# Patient Record
Sex: Male | Born: 1980 | Race: White | Hispanic: No | Marital: Single | Smoking: Never smoker
Health system: Southern US, Community
[De-identification: ages and names within clinical notes are randomized; demographics above are authoritative.]

## PROBLEM LIST (undated history)

## (undated) DIAGNOSIS — F419 Anxiety disorder, unspecified: Secondary | ICD-10-CM

---

## 2001-02-23 ENCOUNTER — Encounter: Admission: RE | Admit: 2001-02-23 | Discharge: 2001-02-23 | Payer: Self-pay | Admitting: Sports Medicine

## 2001-03-09 ENCOUNTER — Encounter: Admission: RE | Admit: 2001-03-09 | Discharge: 2001-03-09 | Payer: Self-pay | Admitting: Sports Medicine

## 2001-09-27 ENCOUNTER — Ambulatory Visit (HOSPITAL_COMMUNITY): Admission: RE | Admit: 2001-09-27 | Discharge: 2001-09-27 | Payer: Self-pay | Admitting: Gastroenterology

## 2002-06-06 ENCOUNTER — Ambulatory Visit (HOSPITAL_COMMUNITY): Admission: RE | Admit: 2002-06-06 | Discharge: 2002-06-06 | Payer: Self-pay | Admitting: Orthopedic Surgery

## 2002-06-06 ENCOUNTER — Encounter: Payer: Self-pay | Admitting: Orthopedic Surgery

## 2011-06-03 ENCOUNTER — Inpatient Hospital Stay (HOSPITAL_COMMUNITY)
Admission: RE | Admit: 2011-06-03 | Discharge: 2011-06-13 | DRG: 430 | Disposition: A | Discharge status: Home / Self Care | Payer: BC Managed Care – PPO | Attending: Psychiatry | Admitting: Psychiatry

## 2011-06-03 ENCOUNTER — Encounter (HOSPITAL_COMMUNITY): Payer: Self-pay | Admitting: *Deleted

## 2011-06-03 DIAGNOSIS — Z8659 Personal history of other mental and behavioral disorders: Secondary | ICD-10-CM

## 2011-06-03 DIAGNOSIS — F319 Bipolar disorder, unspecified: Principal | ICD-10-CM

## 2011-06-03 DIAGNOSIS — F200 Paranoid schizophrenia: Secondary | ICD-10-CM

## 2011-06-03 DIAGNOSIS — Z79899 Other long term (current) drug therapy: Secondary | ICD-10-CM

## 2011-06-03 DIAGNOSIS — F22 Delusional disorders: Secondary | ICD-10-CM

## 2011-06-03 DIAGNOSIS — F411 Generalized anxiety disorder: Secondary | ICD-10-CM

## 2011-06-03 DIAGNOSIS — F481 Depersonalization-derealization syndrome: Secondary | ICD-10-CM

## 2011-06-03 HISTORY — DX: Anxiety disorder, unspecified: F41.9

## 2011-06-03 MED ORDER — ACETAMINOPHEN 325 MG PO TABS: 650.0000 mg | ORAL_TABLET | Freq: Four times a day (QID) | ORAL | Status: DC | PRN

## 2011-06-03 MED ORDER — ALUM & MAG HYDROXIDE-SIMETH 200-200-20 MG/5ML PO SUSP
30.0000 mL | ORAL | Status: DC | PRN
  Administered 2011-06-07: 30 mL via ORAL

## 2011-06-03 MED ORDER — QUETIAPINE FUMARATE 100 MG PO TABS
100.0000 mg | ORAL_TABLET | Freq: Every day | ORAL | Status: DC
  Administered 2011-06-04 – 2011-06-07 (×2): 100 mg via ORAL
  Filled 2011-06-03 (×7): qty 1

## 2011-06-03 MED ORDER — MAGNESIUM HYDROXIDE 400 MG/5ML PO SUSP: 30.0000 mL | Freq: Every day | ORAL | Status: DC | PRN

## 2011-06-03 NOTE — BH Assessment (Signed)
Assessment Note   Levi Moore is an 31 y.o. male. Pt was brought in by his mother after visiting his psychiatrist DR Garner Nash  who recommended him to come here secondary to his agitation and hallucinations.  Patient is exhibiting anxiety, agitation and fear, reporting that there is a lot of staff going on, people following him. Patient reports that there is a lot of things surrounding him and thy are bigger than him. "They wont let me go to work  And everybody knows me ... Pt reports that he stopped taking his medications for anxiety  Because they were impacting his sleeping pattern. Reports that he has been hospitalized at [facility] in [place] "because somebody was following me again". Patient is anxious and agitated, reporting that he does not want to be here, that he the psychiatrist made him come.  Patient lives with his mother but says that mother is very stressed secondary to his situation "she thinks I am sick...and I don't think I will go back to her house".    Axis I: Bipolar, schizophrenia paranoid type Axis II: Deferred Axis III: Anxiety Axis IV: occupational problems, problem with psychosocial environment, problem with primary support group.  Axis V: 21-30 behavior considerably influenced by delusions or hallucinations OR serious impairment in judgment, communication OR inability to function in almost all areas  Past Medical History:  Past Medical History  Diagnosis Date  . Anxiety     No past surgical history on file.  Family History: No family history on file.  Social History:  reports that he has never smoked. He does not have any smokeless tobacco history on file. He reports that he does not use illicit drugs. His alcohol history not on file.  Additional Social History:    Allergies: Not on File  Home Medications:  Medications Prior to Admission  Medication Dose Route Frequency Provider Last Rate Last Dose  . acetaminophen (TYLENOL) tablet 650 mg  650 mg Oral Q6H PRN Franchot Gallo, MD      . alum & mag hydroxide-simeth (MAALOX/MYLANTA) 200-200-20 MG/5ML suspension 30 mL  30 mL Oral Q4H PRN Franchot Gallo, MD      . magnesium hydroxide (MILK OF MAGNESIA) suspension 30 mL  30 mL Oral Daily PRN Franchot Gallo, MD      . QUEtiapine (SEROQUEL) tablet 100 mg  100 mg Oral QHS Franchot Gallo, MD       No current outpatient prescriptions on file as of 06/03/2011.    OB/GYN Status:  No LMP for male patient.  General Assessment Data Location of Assessment: [facility] Assessment Services Living Arrangements: Parent Can pt return to current living arrangement?: Yes Admission Status: Voluntary Is patient capable of signing voluntary admission?: Yes Transfer from: Home Referral Source: Psychiatrist  Education Status Is patient currently in school?: No Current Grade: na Highest grade of school patient has completed: na Name of school: na Contact person: Mansour Balboa (Mother: [phone])  Risk to self Suicidal Ideation: No Suicidal Intent: No Is patient at risk for suicide?: Yes Suicidal Plan?: No Access to Means: Yes Specify Access to Suicidal Means: medications What has been your use of drugs/alcohol within the last 12 months?: NA Previous Attempts/Gestures: No How many times?: 0  Other Self Harm Risks: na Triggers for Past Attempts: Unknown Intentional Self Injurious Behavior: None Family Suicide History: No Recent stressful life event(s): Other (Comment) (mental health change) Persecutory voices/beliefs?: Yes Depression: Yes Depression Symptoms: Tearfulness;Fatigue Substance abuse history and/or treatment for substance abuse?: No Suicide  prevention information given to non-admitted patients: Yes  Risk to Others Homicidal Ideation: No Thoughts of Harm to Others: No Current Homicidal Intent: No Current Homicidal Plan: No Access to Homicidal Means: No Identified Victim: na History of harm to others?: No Assessment of Violence: None Noted Violent  Behavior Description: na Does patient have access to weapons?: No Criminal Charges Pending?: No Does patient have a court date: No  Psychosis Hallucinations: Visual Delusions: None noted;Persecutory  Mental Status Report Appear/Hygiene: Other (Comment) (appropriate) Eye Contact: Good Motor Activity: Agitation;Gestures;Restlessness Speech: Logical/coherent Level of Consciousness: Alert;Restless;Crying Mood: Anxious Affect: Anxious;Apprehensive Anxiety Level: Moderate Thought Processes: Irrelevant;Tangential Judgement: Impaired Orientation: Person;Place;Time;Situation Obsessive Compulsive Thoughts/Behaviors: Moderate  Cognitive Functioning Concentration: Decreased Memory: Recent Intact;Remote Impaired IQ: Average Insight: Poor Impulse Control: Poor Appetite: Fair Weight Loss: 0  Weight Gain: 0  Sleep: Decreased Total Hours of Sleep: 4  Vegetative Symptoms: None  Prior Inpatient Therapy Prior Inpatient Therapy: Yes Prior Therapy Dates: "maybe 2 years ago" Prior Therapy Facilty/Provider(s): [facility] [place] Reason for Treatment: "somebody was following me again"  Prior Outpatient Therapy Prior Outpatient Therapy: Yes Prior Therapy Dates: current Prior Therapy Facilty/Provider(s): unknown Reason for Treatment: mental   ADL Screening (condition at time of admission) Patient's cognitive ability adequate to safely complete daily activities?: Yes Patient able to express need for assistance with ADLs?: Yes Independently performs ADLs?: Yes     Therapy Consults (therapy consults require a physician order) PT Evaluation Needed: No OT Evalulation Needed: No SLP Evaluation Needed: No Abuse/Neglect Assessment (Assessment to be complete while patient is alone) Physical Abuse: Denies Verbal Abuse: Denies Sexual Abuse: Denies Exploitation of patient/patient's resources: Denies Self-Neglect: Denies Values / Beliefs Cultural Requests During Hospitalization: None Spiritual  Requests During Hospitalization: None Consults Spiritual Care Consult Needed: No Social Work Consult Needed: No   Nutrition Screen Diet: Regular  Additional Information 1:1 In Past 12 Months?: No CIRT Risk: Yes Elopement Risk: Yes Does patient have medical clearance?: No     Disposition:  Disposition Disposition of Patient: Inpatient treatment program Type of inpatient treatment program: Adult  On Site Evaluation by:   Reviewed with Physician:     Olin Pia 06/03/2011 11:59 PM

## 2011-06-03 NOTE — Tx Team (Signed)
Initial Interdisciplinary Treatment Plan  PATIENT STRENGTHS: (choose at least two) Active sense of humor Average or above average intelligence Capable of independent living Communication skills General fund of knowledge Motivation for treatment/growth Physical Health Special hobby/interest Supportive family/friends Work skills  PATIENT STRESSORS: Financial difficulties Marital or family conflict Medication change or noncompliance   PROBLEM LIST: Problem List/Patient Goals Date to be addressed Date deferred Reason deferred Estimated date of resolution  "Move forward and feel strong" 06/03/11     "Stability" 06/03/11           Anxiety 06/03/11     Deslusions 06/03/11                              DISCHARGE CRITERIA:  Ability to meet basic life and health needs Adequate post-discharge living arrangements Improved stabilization in mood, thinking, and/or behavior Medical problems require only outpatient monitoring Motivation to continue treatment in a less acute level of care Need for constant or close observation no longer present Reduction of life-threatening or endangering symptoms to within safe limits Safe-care adequate arrangements made Verbal commitment to aftercare and medication compliance  PRELIMINARY DISCHARGE PLAN: Attend aftercare/continuing care group Outpatient therapy Participate in family therapy Return to previous living arrangement  PATIENT/FAMIILY INVOLVEMENT: This treatment plan has been presented to and reviewed with the patient, Levi Moore, and/or family member.  The patient and family have been given the opportunity to ask questions and make suggestions.  [name] 06/03/2011, 11:06 PM

## 2011-06-04 DIAGNOSIS — F22 Delusional disorders: Secondary | ICD-10-CM | POA: Diagnosis present

## 2011-06-04 DIAGNOSIS — F200 Paranoid schizophrenia: Secondary | ICD-10-CM

## 2011-06-04 DIAGNOSIS — F319 Bipolar disorder, unspecified: Secondary | ICD-10-CM | POA: Diagnosis present

## 2011-06-04 DIAGNOSIS — F481 Depersonalization-derealization syndrome: Secondary | ICD-10-CM

## 2011-06-04 MED ORDER — CHLORPROMAZINE HCL 25 MG PO TABS
25.0000 mg | ORAL_TABLET | Freq: Three times a day (TID) | ORAL | Status: DC
  Administered 2011-06-04 – 2011-06-06 (×6): 25 mg via ORAL
  Filled 2011-06-04 (×9): qty 1

## 2011-06-04 MED ORDER — QUETIAPINE FUMARATE 25 MG PO TABS
25.0000 mg | ORAL_TABLET | ORAL | Status: DC | PRN
  Administered 2011-06-04 – 2011-06-10 (×3): 25 mg via ORAL
  Filled 2011-06-04 (×3): qty 1

## 2011-06-04 MED ORDER — QUETIAPINE FUMARATE 25 MG PO TABS
ORAL_TABLET | ORAL | Status: AC
  Administered 2011-06-04: 16:00:00
  Filled 2011-06-04: qty 1

## 2011-06-04 NOTE — Tx Team (Signed)
Interdisciplinary Treatment Plan Update (Adult)  Date:  06/04/2011  Time Reviewed:  10:17 AM   Progress in Treatment: Attending groups:   Yes   Participating in groups:  Yes Taking medication as prescribed:  Yes Tolerating medication:  Yes Family/Significant othe contact made:  Patient understands diagnosis:  Yes Discussing patient identified problems/goals with staff: Yes Medical problems stabilized or resolved: Yes Denies suicidal/homicidal ideation:Yes Issues/concerns per patient self-inventory:  Other:  New problem(s) identified:  Reason for Continuation of Hospitalization: Anxiety Delusions  Depression Medication stabilization  Interventions implemented related to continuation of hospitalization:  Medication Management; safety checks q 15 mins  Additional comments:  Estimated length of stay:  Discharge Plan:  New goal(s):  Review of initial/current patient goals per problem list:    1.  Goal(s):  Reduce/Eliminate paranoia (delusions that someone is out to get him)  Met:  No  Target date: d/c  As evidenced by: Patient will report he has return baseline  2.  Goal (s): Reduce anxiety to less than seven  Met:  No  Target date: d/c  As evidenced by: Levi Moore will have a lower rating on the anxiety scale  3.  Goal(s):  Stabilize on medications  Met:  Yes  Target date: d/c  As evidenced by: Levi Moore will report medications are working - less symptoms  4.  Goal(s):    Met:  No  Target date:  As evidenced by:  Attendees: Patient:  Levi Moore 06/04/2011  11:47 AM  Family:     Physician:  Orson Aloe, MD 06/04/2011 10:17 AM   Nursing:    06/04/2011 10:17 AM   CaseManager:  Juline Patch, LCSW 06/04/2011 10:17 AM   Counselor:  Angus Palms, LCSW 06/04/2011 10:17 AM   Other:  Consuello Bossier, NP 06/04/2011 10:17 AM   Other:  Reyes Ivan, LCSWA 06/04/2011  10:17 AM   Other:     Other:      Scribe for Treatment Team:   Wynn Banker, LCSW,   06/04/2011 10:17 AM

## 2011-06-04 NOTE — Progress Notes (Signed)
Pt was pleasant and cooperative but very anxious during the adm process. Paced back and forth thru most of the adm process. "I think I'm gonna leave. I feel like I did the wrong thing by coming". Pt stated that his Dr told him to come in for the safety of himself and his family. Pt believes that somebody is out to get his family.  Pt states he sees Dr Dub Mikes, outpatient. Pt would not elaborate on his issues. Pt was given 100 mg of seroquel upon arrival to [facility], prior to admission. Support and encouragement was offered.

## 2011-06-04 NOTE — H&P (Signed)
Psychiatric Admission Assessment Adult  Patient Identification:  Levi Moore Date of Evaluation:  06/04/2011 Chief Complaint:  BIPOLAR  History of Present Illness:: Levi Moore is a 31 year old Caucasian male. Patient is a walk-in admission to [facility]. Patient reports, "My mother drove me here on the advice of my doctor. Personally I do not want to be here. I live with my mother. She nags me a lot. I am under a lot of stress. I am very agitated as a result of all the stressors in my life. My doctor convinced me that I need to come to this hospital to be safe. I feel like I am caught in a war between some bad people. I believe that there is another Levi Moore out there. I know he is doing a lot of illegal stuff. I am being blamed for it. It is an identity issue. Some one has an incentive for me to fail. I know his name is Levi Moore. I am watchful where ever I go. To live my life this way, being haunted by so many bad people is unfair. I am anxious, agitated and confused. I have racing thoughts from thinking about all these. I don't want to live with my mother any more. I think she is projecting her feelings towards me. She is the one that should be in the hospital and not me. I need help finding my own place. If I had at least a 3 months rent to start with, I would not live with my mother. I was taken Risperdal at a time. I was on it for a very short time. I did not like how it made me feel. It gave me a schizoid kind of feeling. It felt like out of body experience. I felt the twitch. I did not like it. I am not going to hurt anyone or my self. All I want for myself is to grow, have a good education, work for a good company. Then may be someday, have a family"  Mood Symptoms:  Racing thougts, agitations, anxiety Depression Symptoms:  anxiety, (Hypo) Manic Symptoms:  Delusions, Flight of Ideas, Irritable Mood, Labiality of Mood, Anxiety Symptoms:  Excessive Worry, Psychotic Symptoms:   Delusions, Paranoia,  PTSD Symptoms: Had a traumatic exposure:  None reported  Past Psychiatric History: Diagnosis: Schizophrenia, paranoia type, capgras delusions.  Hospitalizations: [facility]  Outpatient Care:Dr. Garner Nash  Substance Abuse Care: None reported  Self-Mutilation: None reported  Suicidal Attempts:None reported  Violent Behaviors: None reported   Past Medical History:   Past Medical History  Diagnosis Date  . Anxiety    None. Allergies:  Not on File  ROS: Patient is a well groomed, well spoken and well nourished 31 year old caucasian male in no apparent distress. Patient appeared stated age. Alert and oriented x 3. He is aware of situation. Only that he is very suspicious and well guarded in what he says to people.  He denies any chest pains, shortness of breath and or pain/discomforts. Skin areas without any obvious problems.    PTA Medications: Prescriptions prior to admission  Medication Sig Dispense Refill  . risperiDONE (RISPERDAL) 0.25 MG tablet Take 0.25 mg by mouth 2 (two) times daily.        Previous Psychotropic Medications:  Medication/Dose  Risperdal               Substance Abuse History in the last 12 months: Substance Age of 1st Use Last Use Amount Specific Type  Nicotine "I  don't smoke, drink and or do any kind of drugs"     Alcohol      Cannabis      Opiates      Cocaine      Methamphetamines      LSD      Ecstasy      Benzodiazepines      Caffeine      Inhalants      Others:                         Consequences of Substance Abuse: Medical Consequences:  Liver damage Legal Consequences:  Arrests,jail time Family Consequences:  Family discord  Social History: Current Place of Residence: [place]  Place of Birth: [place] Family Members: Mother Marital Status:  Single Children:0  Sons:0  Daughters:0 Relationships: Single Education:  HS Graduate Educational Problems/Performance: None reported Religious Beliefs/Practices:  None reported History of Abuse (Emotional/Phsycial/Sexual): None reported Occupational Experiences: Unemployed Military History:  None. Legal History: None reported Hobbies/Interests:None reported  Family History:  No family history on file.  Mental Status Examination/Evaluation: Objective:  Appearance: Guarded, Neat and Well Groomed, suspicious  Eye Contact::  Good  Speech: Clear, Soft spoken,   Volume:  Normal  Mood:  Anxious and Irritable  Affect:  Suspicious  Thought Process:  Disorganized and Tangential  Orientation:  Full  Thought Content:  Delusions and Paranoid Ideation  Suicidal Thoughts:  No  Homicidal Thoughts:  No  Memory:  Immediate;   Good Recent;   Good Remote;   Good  Judgement:  Impaired  Insight:  Absent  Psychomotor Activity:  Increased, Restlessness and Agitations  Concentration:  Poor  Recall:  Good  Akathisia:  No  Handed:  Right  AIMS (if indicated):     Assets:  Desire for Improvement  Sleep:  Number of Hours: 5.5            Assessment:    AXIS I:  Chronic Paranoid Schizophrenia, Capgras delusional disorder AXIS II:  Deferred AXIS III:   Past Medical History  Diagnosis Date  . Anxiety    AXIS IV:  economic problems and housing problems AXIS V:  41-50 serious symptoms  Treatment Plan/Recommendations: Admit for safety and stabilization.                                                                Review and reinstate any pertinent home medications.                                                                Encourage and re-assure, patient is highly suspicious.  Treatment Plan Summary: Daily contact with patient to assess and evaluate symptoms and progress in treatment Medication management Current Medications:  Current Facility-Administered Medications  Medication Dose Route Frequency Provider Last Rate Last Dose  . acetaminophen (TYLENOL) tablet 650 mg  650 mg Oral Q6H PRN Franchot Gallo, MD      . alum & mag hydroxide-simeth  (MAALOX/MYLANTA) 200-200-20 MG/5ML suspension 30 mL  30 mL Oral Q4H PRN  Franchot Gallo, MD      . magnesium hydroxide (MILK OF MAGNESIA) suspension 30 mL  30 mL Oral Daily PRN Franchot Gallo, MD      . QUEtiapine (SEROQUEL) tablet 100 mg  100 mg Oral QHS Franchot Gallo, MD        Observation Level/Precautions:  Q 15 minutes checks for safety  Laboratory:  CBC Chemistry Profile UA  Psychotherapy:  Group  Medications:  See lists  Routine PRN Medications:  Yes  Consultations:  None indicated  Discharge Concerns: Safety, staying on medications  Other:     Sanjuana Kava 2/6/20139:41 AM

## 2011-06-04 NOTE — Progress Notes (Signed)
Recreation Therapy Notes  06/04/2011         Time: 1415      Group Topic/Focus: The focus of this group is on enhancing patients' problem solving skills, which involves identifying the problem, brainstorming solutions and choosing and trying a solution.   Participation Level: Active  Participation Quality: Redirectable  Affect: Blunted  Cognitive: Confused   Additional Comments: Patient reports he is glad to be here so he can "unwind." Patient on task at times, staring off blankly at others.   Pooja Camuso 06/04/2011 3:14 PM

## 2011-06-04 NOTE — H&P (Signed)
Medical/psychiatric screening examination/treatment/procedure(s) were performed by non-physician practitioner and as supervising physician I was immediately available for consultation/collaboration.   I have seen and examined this patient and agree with this evaluation.  

## 2011-06-04 NOTE — Progress Notes (Signed)
[facility] Group Notes: (Counselor/Nursing/MHT/Case Management/Adjunct) 06/04/2011   @1 :15pm  Type of Therapy:  Group Therapy  Participation Level:  Good  Participation Quality:  Attentive  Affect:  Hypervigilant  Cognitive:  Appropriate  Insight:  Limited  Engagement in Group: Good  Engagement in Therapy:  Good  Modes of Intervention:  Support and Exploration  Summary of Progress/Problems: Ledger was very engaged and nodded along with the statements of many group members. He participated in mindfulness meditation on the statement "Everything is as it should be" and processed his response. Jarel stated that he did not want to be in the hospital, but not that he is here he needs to determine what to do about it. He spoke briefly about staying in the present rather than regretting or worrying. Demarquez continued to relate well to others and stated that he can see himself using mindfulness techniques in his life.   Billie Lade 06/04/2011 3:39 PM

## 2011-06-04 NOTE — [facility] Suicide Risk Assessment (Signed)
Suicide Risk Assessment  Admission Assessment     Demographic factors:  Assessment Details Time of Assessment: Admission Information Obtained From: Patient Current Mental Status:    Loss Factors:  Loss Factors: Financial problems / change in socioeconomic status Historical Factors:    Risk Reduction Factors:  Risk Reduction Factors: Sense of responsibility to family;Living with another person, especially a relative;Positive social support  CLINICAL FACTORS:   Severe Anxiety and/or Agitation Schizophrenia:   Depressive state Less than 77 years old Paranoid or undifferentiated type Previous Psychiatric Diagnoses and Treatments  COGNITIVE FEATURES THAT CONTRIBUTE TO RISK:  Closed-mindedness Loss of executive function Polarized thinking Thought constriction (tunnel vision)    SUICIDE RISK:   Mild:  Suicidal ideation of limited frequency, intensity, duration, and specificity.  There are no identifiable plans, no associated intent, mild dysphoria and related symptoms, good self-control (both objective and subjective assessment), few other risk factors, and identifiable protective factors, including available and accessible social support.  Reason for hospitalization: .Doctor sent him here to be safe  Diagnosis:  Axis I: Schizophrenia Axis II: Deferred  ADL's:  Intact  Sleep: Fair  Appetite:  Fair  Suicidal Ideation:  Denies any suicidal thoughts, but seems so frustrated with how the world is treating him that he may resort to rash acts. Homicidal Ideation:  Denies any homicidal thoughts, but seems so frustrated with how the world is treating him that he may resort to rash acts.   Mental Status Examination/Evaluation: Objective:  Appearance: Meticulous and Neat  Eye Contact::  Good  Speech:  Clear and Coherent  Volume:  Normal  Mood:  Anxious and Irritable  Affect:  Congruent  Thought Process:  focused on how his identity has been used in part or in whole by someone else  and he fears that others will harm him because of that.  Orientation:  Full  Thought Content:  Delusions, Paranoid Ideation and Rumination  Suicidal Thoughts:  No  Homicidal Thoughts:  No  Memory:  Immediate;   Good  Judgement:  Poor  Insight:  Lacking  Psychomotor Activity:  Increased  Concentration:  Fair  Recall:  Fair  Akathisia:  No  AIMS (if indicated):     Assets:  Communication Skills Desire for Improvement Financial Resources/Insurance Housing  Sleep:  Number of Hours: 5.5     Vital Signs: Blood pressure 137/97, pulse 132, temperature 97.9 F (36.6 C), temperature source Oral, resp. rate 18, height 5\' 8"  (1.727 m), weight 82.555 kg (182 lb). Current Medications:  Current Facility-Administered Medications  Medication Dose Route Frequency Provider Last Rate Last Dose  . acetaminophen (TYLENOL) tablet 650 mg  650 mg Oral Q6H PRN Franchot Gallo, MD      . alum & mag hydroxide-simeth (MAALOX/MYLANTA) 200-200-20 MG/5ML suspension 30 mL  30 mL Oral Q4H PRN Franchot Gallo, MD      . chlorproMAZINE (THORAZINE) tablet 25 mg  25 mg Oral TID Orson Aloe, MD   25 mg at 06/04/11 1412  . magnesium hydroxide (MILK OF MAGNESIA) suspension 30 mL  30 mL Oral Daily PRN Franchot Gallo, MD      . QUEtiapine (SEROQUEL) 25 MG tablet           . QUEtiapine (SEROQUEL) tablet 100 mg  100 mg Oral QHS Randy Readling, MD      . QUEtiapine (SEROQUEL) tablet 25 mg  25 mg Oral Q4H PRN Orson Aloe, MD   25 mg at 06/04/11 1845    Lab Results: No results found  for this or any previous visit (from the past 48 hour(s)). Physical Findings: AIMS:   CIWA:     COWS:      Treatment Plan Summary: Daily contact with patient to assess and evaluate symptoms and progress in treatment Medication management  Risk of harm to self is elevated by his history of schizophrenia  Risk of harm to others is elevated by his paranoia and delusional thinking   Plan: We will admit the patient for crisis stabilization  and treatment. I talked to pt about starting Thorazine for anxiety and agitation. I explained the risks and benefits of medication in detail.  We will continue on q. 15 checks the unit protocol. At this time there is no clinical indication for one-to-one observation as patient contract for safety and presents little risk to harm themself and others.  We will increase collateral information. I encourage patient to participate in group milieu therapy. Pt was seen in treatment team meeting today for further treatment and appropriate discharge planning. Please see history and physical note for more detailed information ELOS: 9 to 13 days.   Tonishia Steffy 06/04/2011, 7:21 PM

## 2011-06-04 NOTE — Progress Notes (Signed)
Pt attends groups. Pt is very paranoid and believes we are taping his conversations. Pt states that he does not feel safe to leave her and that people will come and try and hurt him. Pt is very delusional. Pt continues to pace up and down the hall ways. Pt was redirected that he is safe and if he needed anything to let someone know. Pt was offered support and encouragement. Pt safety maintained on unit.

## 2011-06-04 NOTE — Progress Notes (Signed)
[facility] Group Notes:  (Counselor/Nursing/MHT/Case Management/Adjunct)  06/04/2011 3:14 PM  Type of Therapy:  Group Therapy  Participation Level:  Active  Participation Quality:  Appropriate, Attentive and Sharing  Affect:  Appropriate  Cognitive:  Alert and Appropriate  Insight:  Good  Engagement in Group:  Good  Engagement in Therapy:  Good  Modes of Intervention:  Education, Support and Exploration  Summary of Progress/Problems: Patient reported feeling anxious about his future. He stated he does not know if being here in the hospital was benefiting him. He also stated the only reason he is here in the hospital is because his mother suggested that he be admitted.   [name] 06/04/2011, 3:14 PM  Cosigned by: Angus Palms, LCSW
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
## 2011-06-05 MED ORDER — OMEGA-3-ACID ETHYL ESTERS 1 G PO CAPS
1.0000 g | ORAL_CAPSULE | Freq: Every day | ORAL | Status: DC
  Administered 2011-06-05 – 2011-06-13 (×8): 1 g via ORAL
  Filled 2011-06-05 (×11): qty 1

## 2011-06-05 NOTE — Progress Notes (Signed)
Patient ID: Levi Moore, male   DOB: 05-06-1980, 31 y.o.   MRN: [id number] Patient was pleasant and cooperative during the assessment. Continues to say that "he's not sure if he made the right decision to come".  Support and encouragement was offered.

## 2011-06-05 NOTE — Progress Notes (Signed)
On patient self inventory sheet, patient sleeps well/fair, good appetite, normal energy level, improving attention span.   Rated depression and hopeless #4.  Denied SI.  Worst pain #2.  "keep trying to find work and put myself in a situation to happen to get a fresh start.   I will find a way to have a life that is my own and new and live it with kindness, fruitfulness and long filled with good memories.  This is quite a hurdle.   If it helps with my anxiety and pressure then it could be the correct one."

## 2011-06-05 NOTE — Progress Notes (Signed)
[facility] MD Progress Note  06/05/2011 4:15 PM  "I am feeling well actually. My anxiety is down, but still I feel anxious. I don't know if going home to live with my mother is the best thing for me right now. I have to think hard to decide. I talked to my dad yesterday. He seem like he is the one That has my interest at heart right now. My mother is nice, it is just she nags me too much. I guess I will really think hard to make my decision"   Diagnosis:   Axis I: Schizophrenia, paranioa type, Capgras delusions. Axis II: Deferred Axis III:  Past Medical History  Diagnosis Date  . Anxiety    Axis IV: economic problems, occupational problems, other psychosocial or environmental problems and problems related to social environment Axis V: 51-60 moderate symptoms  ADL's:  Intact  Sleep: Good  Appetite:  Good  Suicidal Ideation:  Plan:  No Intent:  No Means:  No Homicidal Ideation:  Plan:  No Intent:  no Means:  no  AEB (as evidenced by): Per patient's reports  Mental Status Examination/Evaluation: Objective:  Appearance: Casual, Neat and Well Groomed  Eye Contact::  Good  Speech:  Clear and Coherent  Volume:  Normal  Mood:  Anxious  Affect:  Appropriate  Thought Process:  Coherent  Orientation:  Full  Thought Content:  Rumination, paranoia  Suicidal Thoughts:  No  Homicidal Thoughts:  No  Memory:  Immediate;   Good Recent;   Good Remote;   Good  Judgement:  Impaired  Insight:  Fair  Psychomotor Activity:  Normal  Concentration:  Fair  Recall:  Good  Akathisia:  No  Handed:  Right  AIMS (if indicated):     Assets:  Desire for Improvement  Sleep:  Number of Hours: 6.75    Vital Signs:Blood pressure 122/87, pulse 112, temperature 98.6 F (37 C), temperature source Oral, resp. rate 16, height 5\' 8"  (1.727 m), weight 82.555 kg (182 lb). Current Medications: Current Facility-Administered Medications  Medication Dose Route Frequency Provider Last Rate Last Dose  .  acetaminophen (TYLENOL) tablet 650 mg  650 mg Oral Q6H PRN Franchot Gallo, MD      . alum & mag hydroxide-simeth (MAALOX/MYLANTA) 200-200-20 MG/5ML suspension 30 mL  30 mL Oral Q4H PRN Franchot Gallo, MD      . chlorproMAZINE (THORAZINE) tablet 25 mg  25 mg Oral TID Orson Aloe, MD   25 mg at 06/05/11 1454  . magnesium hydroxide (MILK OF MAGNESIA) suspension 30 mL  30 mL Oral Daily PRN Franchot Gallo, MD      . omega-3 acid ethyl esters (LOVAZA) capsule 1 g  1 g Oral Daily Orson Aloe, MD   1 g at 06/05/11 1416  . QUEtiapine (SEROQUEL) tablet 100 mg  100 mg Oral QHS Franchot Gallo, MD   100 mg at 06/04/11 2241  . QUEtiapine (SEROQUEL) tablet 25 mg  25 mg Oral Q4H PRN Orson Aloe, MD   25 mg at 06/04/11 1845    Lab Results: No results found for this or any previous visit (from the past 48 hour(s)).  Physical Findings: AIMS:  , ,  ,  ,    CIWA:    COWS:     Treatment Plan Summary: Daily contact with patient to assess and evaluate symptoms and progress in treatment Medication management Reassurance and encouragement provided  Plan: Continue current treatment plan.  Armandina Stammer I 06/05/2011, 4:15 PM

## 2011-06-05 NOTE — Tx Team (Signed)
Interdisciplinary Treatment Plan Update (Adult)  Date:  06/05/2011  Time Reviewed:  10:23 AM   Progress in Treatment: Attending groups: Yes Participating in groups:  Yes Taking medication as prescribed:  Yes Tolerating medication: Yes Family/Significant othe contact made: No, requesting consent to contact mother  Patient understands diagnosis: No, still believes others are following and trying to harm him Discussing patient identified problems/goals with staff:  Yes Medical problems stabilized or resolved: Yes Denies suicidal/homicidal ideation: Yes Issues/concerns per patient self-inventory:  No  Other:  New problem(s) identified: None  Reason for Continuation of Hospitalization: Delusions  Medication Stabilization  Interventions implemented related to continuation of hospitalization:  Medication stabilization, safety checks q 15 mins, group attendance  Additional comments: Much less anxious but still endorse the belief that there are "things bigger than me" out there that have people following him and wanting to harm him  Estimated length of stay: 2-3 days  Discharge Plan: discharge home and follow up with Dr. Garner Nash at [facility]  New goal(s):   Review of initial/current patient goals per problem list:   1.  Goal(s): Reduce/eliminate paranoia (delusions that someone is out to get him)  Met:  No  Target date: by discharge  As evidenced by: Levi Moore continues to report that he believes someone is trying to hurt him  2.  Goal (s): Reduce anxiety to less than seven  Met:  Yes  Target date: by discharge  As evidenced by: Levi Moore rates depression at 4 today, down from 10 at admission  3.  Goal(s): Stabilize on medications  Met:  Yes  Target date: by discharge  As evidenced by: Levi Moore reports that the Thorazine has helped to calm him down and that he feels more clear-minded and less threatened due to medications    Attendees: Patient:     Family:       Physician:    Nursing:  Quintella Reichert, RN 06/05/2011 10:23 AM  Case Manager:  Juline Patch, LCSW 06/05/2011 10:23 AM  Counselor:  Angus Palms, LCSW 06/05/2011 10:23 AM  Other:  Reyes Ivan, LCSWA 06/05/2011 10:23 AM  Other:  Serena Colonel, NP 06/05/2011 10:23 AM  Other:  [name],  Counseling intern 06/05/2011 10:23 AM  Other:  Trinda Pascal, Rn 06/05/2011 10:23 AM   Scribe for Treatment Team:   Billie Lade, 06/05/2011 10:23 AM

## 2011-06-05 NOTE — Progress Notes (Signed)
[facility] Group Notes:  (Counselor/Nursing/MHT/Case Management/Adjunct)  06/05/2011 12:58 PM  Type of Therapy:  Group Therapy  Participation Level:  Active  Participation Quality:  Appropriate, Attentive and Sharing  Affect:  Appropriate  Cognitive:  Alert and Delusional  Insight:  Poor  Engagement in Group:  Good  Engagement in Therapy:  Good  Modes of Intervention:  Education and Exploration  Summary of Progress/Problems: Patient reported feeling as if he depends heavily on his parents. However, he realizes he must "become reliant on his own existence". He reported his father not being of much support during this time of crisis. Patient knows that he must make some tough decisions once he completes his stay here in the hospital.    Levi Moore 06/05/2011, 12:58 PM   Levi Moore  stated in the past he relied a lot on his parent for finances and other means of survival, but he has now decided to become more independent. He seemed to be grounded in reality for the first several minutes of discussion, but then began alluding to "taking [himself] out of the equation" by disappearing so that his family is not harmed by "things bigger than [him]" that are threatening him. Levi Moore stated that he is at the mercy of "things that started long before [he] was born" and that he knows he needs to disappear and start all over in a place and life that no one knows about. Although his speech was not pressured and he did not escalate affectively during the statements he made, Levi Moore seemed to increase in passion and energy as he spoke, and his conviction seemed to draw stronger.   Cosigned by: Angus Palms, LCSW

## 2011-06-06 DIAGNOSIS — F22 Delusional disorders: Secondary | ICD-10-CM

## 2011-06-06 MED ORDER — CHLORPROMAZINE HCL 25 MG PO TABS
25.0000 mg | ORAL_TABLET | Freq: Four times a day (QID) | ORAL | Status: DC
  Administered 2011-06-06 – 2011-06-10 (×9): 25 mg via ORAL
  Filled 2011-06-06 (×20): qty 1

## 2011-06-06 MED ORDER — CHLORPROMAZINE HCL 25 MG PO TABS
25.0000 mg | ORAL_TABLET | Freq: Three times a day (TID) | ORAL | Status: DC | PRN
  Administered 2011-06-06: 25 mg via ORAL
  Filled 2011-06-06: qty 2
  Filled 2011-06-06 (×2): qty 1

## 2011-06-06 NOTE — Progress Notes (Addendum)
Patient ID: Levi Moore, male   DOB: 1980/12/16, 31 y.o.   MRN: [id number] Pt had pretty calm this AM until he learned that another patient had had the same last name before she got married.  This other patient is leaving today and he interpreted that since there were to Reeds, then after one goes home the other will be tortured and killed.  He got most upset over this.  He asked to talk to the counselor about this.   He agreed to try an extra dose of Seroquel for this.  I later spoke with him and reasoned with him that he is okay, but that he gets very upset at times and over minute things that others might not think a thing about.   He noted that the Thorazine does help him be calm and helps calm his nausea.  He is most thankful for that.  He extrapolated that he should have given a different reaction to learning that information.  He was reassured that he is okay and that he is exactly where he needs to be to figure out what is best for him.  He agrees to increase the Thorazine to QID and to take extra doses when he gets upset in the future.  Thorazine 25 mg TID PRN ordered for him.  He was also informed of the acronym FEAR standing for future events appearing real.  He was challenged to consider taking an extra Thorazine whenever he is getting afraid and he is actually considering future events to being true in the present.  He then seemed calmer and was able to leave the consult room easily.  He later knocked on my door to say that he really didn't know that person that was from the [employer].  He had a great deal of anxiety in his face when he reported that information. Pt is still quite psychotic with self referential notions and extrapolations of his thoughts.

## 2011-06-06 NOTE — Progress Notes (Signed)
Adult Services Patient-Family Contact/Session  Attendees:  Counselor, Keshaun's father, Jediah Horger  Goal(s):  To gather collateral information  Safety Concerns:  No safety concerns  Narrative:  Larita Fife stated that Kayode behavior began to change at age 31 or 56 when parents divorced. He stated that Yaziel became preoccupied with money and the idea of his inheritance as a teenager, to the point that when his father and step-mother brought home his adopted sister from [place] 8 years ago, he would not come to meet them at the airport because he was upset that now he would have less inheritance. This is significant because his paranoia now includes people trying to steal his identity and kill him to get his inheritance. Larita Fife reported that this is the "fourth or fifth" psychiatric hospitalization for Mirko, and that his actual delusions started when Lakshya and his wife divorced a couple of years ago. He indicated that Brave was very angry about his ex-wife buying a house without consulting him, and this led to some of his delusions about being set-up. Larita Fife reported that Khalil has been on several different medications but has never been "stable" or without delusions. He stated that Jilda Panda often "cycles out" of his paranoia after about a week, but that it is always there and that he can "bring it out of [place]" by suggesting or pushing that he get a job. Counselor informed Larita Fife that Julia is stating he does not want to return to his mother's home, and asked whether there are any other options where he could live. Larita Fife stated that there are not; Alhaji has lived with an aunt in [place] who kicked him out and cannot live with Larita Fife due to stepmother and younger sister being afraid of him. He indicated that he would like for Lord to have other options, but that he will not consent to any kind of group living situation such as an assisted living. He stated he wants to be there and support Etheridge but does not know how to do so.  Larita Fife stated that he had Miranda write him a letter a week or so ago outlining what he believes is happening, and can provide staff with the letter if needed.   Barrier(s):  Vinnie's paranoia and belief that the [place] are out to get him.  Interventions:  Counselor gathered collateral information. Counselor explored possible options for where Tayshon may live.   Recommendation(s):  Laurier to remain in [place] and continue to attempt to find a stabilizing medication  Follow-up Required:  No  Explanation:    Billie Lade 06/06/2011, 5:01 PM

## 2011-06-06 NOTE — Progress Notes (Signed)
[place] signed consent to speak with father, [name], after much hesitation. Counselor attempted contact with father, and after explaining to [name] that father had not answered but counselor left message on voicemail, [name] wondered aloud if he had "done the right thing" by giving consent. Counselor then got a call back from father and spoke with him, but following this Levi Moore revoked consent and physically took back the form he gave consent on. He refuses to sign another consent form.   Cosigned by: Angus Palms, LCSW
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
## 2011-06-06 NOTE — Progress Notes (Signed)
Patient seen during d/c planning group.  He reports being better today but to endorse concerns for his safety.  He continues to have concerns about allowing collateral contact with mother or other support persons.  His follow up is scheduled with [facility].

## 2011-06-06 NOTE — Progress Notes (Signed)
[facility] Group Notes:  (Counselor/Nursing/MHT/Case Management/Adjunct)  06/06/2011 12:31 PM  Type of Therapy:  Group Therapy  Participation Level:  Minimal  Participation Quality:  Drowsy  Affect:  Appropriate  Cognitive:  Appropriate  Insight:  Limited  Engagement in Group:  Limited  Engagement in Therapy:  Limited  Modes of Intervention:  Support  Summary of Progress/Problems: Kendle slept through most of group, but began to participate toward the end, expressing fears that he had made a mistake by coming to the hospital.  Luanna Cole 06/06/2011, 12:31 PM

## 2011-06-06 NOTE — Progress Notes (Signed)
Pt continues to be very paranoid and still believes people are out to harm him. Pt delusional thought seem to have pt on edge and very anxious. Pt was offered support and encouragement. Pt receptive to treatment and safety maintained on unit.

## 2011-06-06 NOTE — Progress Notes (Signed)
[facility] Group Notes:  (Counselor/Nursing/MHT/Case Management/Adjunct)  06/06/2011 3:03 PM  Type of Therapy:  1:15PM [employer] Group  Participation Level:  Active  Participation Quality:  Attentive, Intrusive, Redirectable and Sharing  Affect:  Anxious  Cognitive:  Delusional  Insight:  Limited  Engagement in Group:  Limited  Engagement in Therapy:  Limited  Modes of Intervention:  Education  Summary of Progress/Problems: Patient seemed to present with multiple thoughts of wanting to harm someone. Patient stated "I will take that m*therf*cker by the balls...". Patient then began to discuss someone being a " spoiled brat"; however, patient was very vague about identifying this individual. Patient then stood up and did a karate kick in the air, exhibiting how he would kick this particular individual. Patient had to be redirected by peer support representative once or twice during group discussion.    Levi Moore 06/06/2011, 3:03 PM  cortisporin otic

## 2011-06-06 NOTE — Progress Notes (Signed)
Pt is suspicious and restless  He has been pacing the halls  He would come to the medication window then change his mind and say he did not want his medications yet  When he finally did take his medications he refused the seroquel then about 30 min later came to the nurses station requesting to take seroquel  Nurse asked him to give her a few minutes as she was busy with another pt  He agreed but then layed down in his room and went to sleep  Verbal support given  Medications administered and effectiveness monitored   Pt safe at present

## 2011-06-07 MED ORDER — QUETIAPINE FUMARATE 100 MG PO TABS
100.0000 mg | ORAL_TABLET | Freq: Every day | ORAL | Status: DC
  Administered 2011-06-07 – 2011-06-09 (×3): 100 mg via ORAL
  Filled 2011-06-07 (×3): qty 1

## 2011-06-07 NOTE — Progress Notes (Signed)
[facility] Group Notes:  (Counselor/Nursing/MHT/Case Management/Adjunct)  06/07/2011 1315  Type of Therapy:  Group Therapy  Participation Level:  Active  Participation Quality:  Appropriate, Attentive, Sharing and Supportive  Affect:  Appropriate  Cognitive:  Appropriate  Insight:  Good  Engagement in Group:  Good  Engagement in Therapy:  Good  Modes of Intervention:  Clarification, Problem-solving, Socialization and Support  Summary of Progress/Problems: Pt attended and participated in group counseling session on self-sabotage and enabling behaviors/beliefs. Pt states that he puts too much faith in others and would like to start forming new relationships that are not sabotaging. Pt had a positive outlook and was insightful.    Mena Lienau 06/07/2011, 3:27 PM

## 2011-06-07 NOTE — Progress Notes (Signed)
Pt did not come to the med window for meds. Writer went to pt's bedside to offer meds. Pt was lying in bed but responded after the first call of his name .Pt walked to med window. Pt refused Seroquel and took thorazine only at 2310. Pt later spoke with tech, who reported to this writer that the pt wanted to see me. This Clinical research associate was on the phone with on-call physician, and charge nurse went to visit pt instead. This Clinical research associate meet up with charge RN and pt, as he was debating on whether or not to take the Seroquel. Pt believes that if someone see what he is taking, they will think that he is psychotic. Consulting civil engineer and Conservation officer, nature explained the indications of Seroquel and why it was specifically ordered for him. Charge Rn left.This Clinical research associate specifically read the indications written by the  prescribing physician for this pt. Pt left med window and returned several times debating whether he should take the med. Pt keep asking " what would happen if did or did not take the pill". Pt was explained again the effects of medication in relation to prescribed indications and side effect of drowsiness. Pt said I can go to sleep on my own. Pt was reminded of his rights of refusing meds. Pt was reminded several times of the right to refuse meds, but keep returning to possibly take the med. Writer informed pt that I will place med on the med window bar and if wants to he can take it or he can walk away and I will throw the med away. Pt walks off and returns shortly afterwards to take med. Pt then returns and says he doesn't want to take med and that he will make himself vomit the med out. Writer informed the pt that it was not healthy. Pt said well come and watch. Pt was explained that he could possibly put on the 1:1 for safety reasons. Pt was walked down to the nurses station, where he continued to make the above statement. Charge Rn contacted to help reason with the pt. Charge nurse gave pt Mylanta. Tech has been informed to contact  nurse if pt is suspected of vomiting during q70min checks. At this time pt remains safe with q60min checks.

## 2011-06-07 NOTE — Progress Notes (Signed)
Patient ID: Levi Moore, male   DOB: 02/28/1981, 31 y.o.   MRN: [id number] [facility] MD Progress Note  06/07/2011 9:11 PM  Wants to go home as soon as possible. Very guarded most of the time. appeard very delusional. Not willing to tallk much and only focused about the discharge. At times he was threatning to me by saying ''it is not  Right thing to not discharge him''. Not interested to take meds. Complains he got his QHS seorquel when he was sleeping and he did not swallow it after taking it. appeared to be psychotic at this time. Mother was updated about his progress by me and and nursing staff.  Diagnosis:   Axis I: Schizophrenia, paranioa type, Capgras delusions. Axis II: Deferred Axis III:  Past Medical History  Diagnosis Date  . Anxiety    Axis IV: economic problems, occupational problems, other psychosocial or environmental problems and problems related to social environment Axis V: 51-60 moderate symptoms  ADL's:  Intact  Sleep: Good  Appetite:  Good  Suicidal Ideation:  Plan:  No Intent:  No Means:  No Homicidal Ideation:  Plan:  No Intent:  no Means:  no  AEB (as evidenced by): Per patient's reports  Mental Status Examination/Evaluation: Objective:  Appearance: Casual, Neat and Well Groomed  Eye Contact::  Good  Speech:  Clear and Coherent  Volume:  Normal  Mood:  Anxious  Affect:   blunted  Thought Process:  Coherent  Orientation:  Full  Thought Content:  Rumination, paranoia  Suicidal Thoughts:  No  Homicidal Thoughts:  No  Memory:  Immediate;   Good Recent;   Good Remote;   Good  Judgement:  Impaired  Insight:  imapird  Psychomotor Activity:  Normal  Concentration:  Fair  Recall:  Good  Akathisia:  No  Handed:  Right  AIMS (if indicated):     Assets:  Desire for Improvement     Vital Signs:Blood pressure 123/85, pulse 82, temperature 97.9 F (36.6 C), temperature source Oral, resp. rate 17, height 5\' 8"  (1.727 m), weight 82.555 kg (182 lb). Current  Medications: Current Facility-Administered Medications  Medication Dose Route Frequency Provider Last Rate Last Dose  . acetaminophen (TYLENOL) tablet 650 mg  650 mg Oral Q6H PRN Franchot Gallo, MD      . alum & mag hydroxide-simeth (MAALOX/MYLANTA) 200-200-20 MG/5ML suspension 30 mL  30 mL Oral Q4H PRN Franchot Gallo, MD   30 mL at 06/07/11 0121  . chlorproMAZINE (THORAZINE) tablet 25 mg  25 mg Oral QID Orson Aloe, MD   25 mg at 06/07/11 1846  . chlorproMAZINE (THORAZINE) tablet 25 mg  25 mg Oral TID PRN Orson Aloe, MD   25 mg at 06/06/11 1449  . magnesium hydroxide (MILK OF MAGNESIA) suspension 30 mL  30 mL Oral Daily PRN Franchot Gallo, MD      . omega-3 acid ethyl esters (LOVAZA) capsule 1 g  1 g Oral Daily Orson Aloe, MD   1 g at 06/07/11 0843  . QUEtiapine (SEROQUEL) tablet 100 mg  100 mg Oral Q2000 Wonda Cerise, MD   100 mg at 06/07/11 2008  . QUEtiapine (SEROQUEL) tablet 25 mg  25 mg Oral Q4H PRN Orson Aloe, MD   25 mg at 06/04/11 1845  . DISCONTD: QUEtiapine (SEROQUEL) tablet 100 mg  100 mg Oral QHS Franchot Gallo, MD   100 mg at 06/07/11 0105    Lab Results: No results found for this or any previous visit (from the  past 48 hour(s)).  Physical Findings: AIMS:  , ,  ,  ,    CIWA:    COWS:     Treatment Plan Summary: Daily contact with patient to assess and evaluate symptoms and progress in treatment Medication management Reassurance and encouragement provided  Plan:   Continue current treatment plan.  2. Will give Seroquel early tonight  3. Will consider increasing Seroquel tomorrow  Wonda Cerise 06/07/2011, 9:11 PM

## 2011-06-08 NOTE — Progress Notes (Addendum)
therapist met with Levi Moore at the request of his nurse Thayer Ohm to se if a family session in needed, following her conversation with the Levi Moore's mother. Levi Moore spoke about feeling that people are out to get him and that he is in danger. He spoke about being worried about his family and not being sure if his mother and father are supportive of him or not. Levi Moore was unsure if therapist could contact his mother and father. Therapist explained that he has currently given permission to allow staff to talk to both his mother and father. Levi Moore stated he was "unsure" if this was ok or not. Levi Moore was encouraged to wait until he was "sure" to revoke the consent. A family session may be helpful once Levi Moore is clear and experiencing less paranoia, but at this time he can not fully participate in a family session.

## 2011-06-08 NOTE — Progress Notes (Signed)
Pt has attended the groups today. Affect remains flat and mood agitated at times. Denies SI and HI. Is delusional in his thinking. Continues to believe that others have stolen his idenity and that he needs to be placed in  protective care with a new identity. Pt did state that he wants to take his medications because "I am not sick enough to go to a state hospital". Given support and gently confronted that his thinking is due to him not taking his medications.

## 2011-06-08 NOTE — Progress Notes (Signed)
Patient ID: SEVAN MCBROOM, male   DOB: 18-Apr-1981, 31 y.o.   MRN: [id number] Patient ID: JESSEN SIEGMAN, male   DOB: 1980-11-26, 31 y.o.   MRN: [id number] [facility] MD Progress Note  06/08/2011 8:26 PM  No significant improvement form yesterday. Wants to go home as soon as possible. Very guarded most of the time. appeard delusional. Not willing to tallk much and only focused about the discharge. Reports taking all his meds including Seroquel. Not sure whether he should take his meds or not at this time. appeared to be psychotic at this time. Wanted to talk and ask question but could not express much.  Diagnosis:   Axis I: Schizophrenia, paranioa type, Capgras delusions. Axis II: Deferred Axis III:  Past Medical History  Diagnosis Date  . Anxiety    Axis IV: economic problems, occupational problems, other psychosocial or environmental problems and problems related to social environment Axis V: 51-60 moderate symptoms  ADL's:  Intact  Sleep: Good  Appetite:  Good  Suicidal Ideation:  Plan:  No Intent:  No Means:  No Homicidal Ideation:  Plan:  No Intent:  no Means:  no  AEB (as evidenced by): Per patient's reports  Mental Status Examination/Evaluation: Objective:  Appearance: Casual, Neat and Well Groomed  Eye Contact::  Good  Speech:  Clear and Coherent  Volume:  Normal  Mood:  Anxious  Affect:   blunted  Thought Process:  Coherent  Orientation:  Full  Thought Content:  Rumination, paranoia  Suicidal Thoughts:  No  Homicidal Thoughts:  No  Memory:  Immediate;   Good Recent;   Good Remote;   Good  Judgement:  Impaired  Insight:  imapird  Psychomotor Activity:  Normal  Concentration:  Fair  Recall:  Good  Akathisia:  No  Handed:  Right  AIMS (if indicated):     Assets:  Desire for Improvement     Vital Signs:Blood pressure 118/83, pulse 134, temperature 98.2 F (36.8 C), temperature source Oral, resp. rate 18, height 5\' 8"  (1.727 m), weight 82.555 kg (182 lb). Current  Medications: Current Facility-Administered Medications  Medication Dose Route Frequency Provider Last Rate Last Dose  . acetaminophen (TYLENOL) tablet 650 mg  650 mg Oral Q6H PRN Franchot Gallo, MD      . alum & mag hydroxide-simeth (MAALOX/MYLANTA) 200-200-20 MG/5ML suspension 30 mL  30 mL Oral Q4H PRN Franchot Gallo, MD   30 mL at 06/07/11 0121  . chlorproMAZINE (THORAZINE) tablet 25 mg  25 mg Oral QID Orson Aloe, MD   25 mg at 06/08/11 1652  . chlorproMAZINE (THORAZINE) tablet 25 mg  25 mg Oral TID PRN Orson Aloe, MD   25 mg at 06/06/11 1449  . magnesium hydroxide (MILK OF MAGNESIA) suspension 30 mL  30 mL Oral Daily PRN Franchot Gallo, MD      . omega-3 acid ethyl esters (LOVAZA) capsule 1 g  1 g Oral Daily Orson Aloe, MD   1 g at 06/08/11 0906  . QUEtiapine (SEROQUEL) tablet 100 mg  100 mg Oral Q2000 Wonda Cerise, MD   100 mg at 06/07/11 2008  . QUEtiapine (SEROQUEL) tablet 25 mg  25 mg Oral Q4H PRN Orson Aloe, MD   25 mg at 06/04/11 1845    Lab Results: No results found for this or any previous visit (from the past 48 hour(s)).   Treatment Plan Summary: Daily contact with patient to assess and evaluate symptoms and progress in treatment Medication management Reassurance and encouragement provided  Plan:   Continue current treatment plan.   Wonda Cerise 06/08/2011, 8:26 PM

## 2011-06-08 NOTE — Progress Notes (Signed)
Pt is delusional. Told another staff member that this writer has 3 arms. One of them is invisible. Angry and agitated with his mother and yelling at her on the phone. Feels that she is not supportive and that we are all against him, plotting. Talked with Pt's mother at length and She would like Pt to be placed in a group home after leaving here. Feels that him being at home with her will either of them any good. Mother anxious and very concerned about Pt. Has a good understanding of Pt's delusions. Pt denies SI

## 2011-06-08 NOTE — Progress Notes (Signed)
Patient seen at medication window and ready for his 2200 medication, patient had taken his 2000 medication earlier. Patient voiced no complaints reports that he is ready to lay down because he feels that the seroquel is making him drowsy, pt denies having pain, -si/hi/a/v hall. Safety maintained on unit will continue to monitor.

## 2011-06-08 NOTE — Progress Notes (Signed)
[facility] Group Notes:  (Counselor/Nursing/MHT/Case Management/Adjunct)  06/08/2011 1315  Type of Therapy:  Group Therapy  Participation Level:  Active  Participation Quality:  Redirectable and Sharing  Affect:  Appropriate  Cognitive:  Appropriate, Confused   Insight:  Limited  Engagement in Group:  Good  Engagement in Therapy:  Good  Modes of Intervention:  Activity, Clarification, Problem-solving and Support  Summary of Progress/Problems: Pt attended and participated in group session on support. Pt could not identify a support in his life, however, pt mentioned that he thought he had a support but he is not sure if his support was able to help him. Pt appeared to be confused and could not get his thoughts into words. Pt spoke to Clinical research associate after group session and explained that he thinks he has a friend that is supportive but is afraid to call to reach out. Pt was encouraged to reach out for support when needed.   Keenan Trefry 06/08/2011, 2:38 PM

## 2011-06-09 NOTE — Progress Notes (Signed)
Patient ID: Levi Moore, male   DOB: 15-Apr-1981, 31 y.o.   MRN: [id number] Pt denies SI/HI/AVH.  He reported his sleep was well, appetite good, ability to focus as good, and 2/10 depression and hopelessness on his sefl-inventory.  Levi Moore is anxious about being transferred to different facilities without anyone notifying his parents and that no one will know where he is.  The patient was reassured that this would not happen and a transfer was not in progress.  He refused to take his thorazine and omega-3 medications this am because he feels the Seroquel is enough.  This RN did explain why he was on the medication but let him know that it was his right to refuse them.

## 2011-06-09 NOTE — Progress Notes (Signed)
[facility] MD Progress Note  06/09/2011 1:10 PM  "I was started on Seroquel few days ago. It helps me sleep better I believe. I did not think that Thorazine is doing the job. I think that I will like Seroquel better. I am still wondering about all the thing that I am going to face after being discharged. I  am weighing on going back to [employer] to work after my discharge from here"  Diagnosis:   Axis I: Chronic Paranoid Schizophrenia Axis II: Deferred Axis III:  Past Medical History  Diagnosis Date  . Anxiety    Axis IV: economic problems, occupational problems and other psychosocial or environmental problems Axis V: 51-60 moderate symptoms  ADL's:  Intact  Sleep: Good  Appetite:  Good  Suicidal Ideation:  Plan:  No Intent:  No Means:  No Homicidal Ideation:  Plan:  No Intent:  No Means:  No  AEB (as evidenced by):  Mental Status Examination/Evaluation: Objective:  Appearance: Casual  Eye Contact::  Good  Speech:  Clear and Coherent  Volume:  Normal  Mood:  Anxious  Affect:  Appropriate  Thought Process:  Disorganized  Orientation:  Full  Thought Content:  Paranoid Ideation  Suicidal Thoughts:  No  Homicidal Thoughts:  No  Memory:  Immediate;   Good  Judgement:  Impaired  Insight:  Fair  Psychomotor Activity:  Normal  Concentration:  Fair  Recall:  Fair  Akathisia:  No  Handed:  Right  AIMS (if indicated):     Assets:  Desire for Improvement  Sleep:  Number of Hours: 6.5    Vital Signs:Blood pressure 133/85, pulse 118, temperature 97 F (36.1 C), temperature source Oral, resp. rate 16, height 5\' 8"  (1.727 m), weight 82.555 kg (182 lb). Current Medications: Current Facility-Administered Medications  Medication Dose Route Frequency Provider Last Rate Last Dose  . acetaminophen (TYLENOL) tablet 650 mg  650 mg Oral Q6H PRN Franchot Gallo, MD      . alum & mag hydroxide-simeth (MAALOX/MYLANTA) 200-200-20 MG/5ML suspension 30 mL  30 mL Oral Q4H PRN Franchot Gallo,  MD   30 mL at 06/07/11 0121  . chlorproMAZINE (THORAZINE) tablet 25 mg  25 mg Oral QID Orson Aloe, MD   25 mg at 06/08/11 2242  . chlorproMAZINE (THORAZINE) tablet 25 mg  25 mg Oral TID PRN Orson Aloe, MD   25 mg at 06/06/11 1449  . magnesium hydroxide (MILK OF MAGNESIA) suspension 30 mL  30 mL Oral Daily PRN Franchot Gallo, MD      . omega-3 acid ethyl esters (LOVAZA) capsule 1 g  1 g Oral Daily Orson Aloe, MD   1 g at 06/08/11 0906  . QUEtiapine (SEROQUEL) tablet 100 mg  100 mg Oral Q2000 Wonda Cerise, MD   100 mg at 06/08/11 2036  . QUEtiapine (SEROQUEL) tablet 25 mg  25 mg Oral Q4H PRN Orson Aloe, MD   25 mg at 06/04/11 1845    Lab Results: No results found for this or any previous visit (from the past 48 hour(s)).  Physical Findings: AIMS:  , ,  ,  ,    CIWA:    COWS:     Treatment Plan Summary: Daily contact with patient to assess and evaluate symptoms and progress in treatment Medication management  Plan: Continue current treatment plan.  Armandina Stammer I 06/09/2011, 1:10 PM

## 2011-06-09 NOTE — Tx Team (Signed)
Interdisciplinary Treatment Plan Update (Adult)  Date:  06/09/2011  Time Reviewed:  9:45 AM   Progress in Treatment: Attending groups:   Yes   Participating in groups:  Yes Taking medication as prescribed:  At times, sometimes refuses Tolerating medication:  Yes Family/Significant othe contact made: Yes, contact made with father Patient understands diagnosis:  No, believes he is not sick, just "anxious" and no insight into non-reality of delusions Discussing patient identified problems/goals with staff: Yes Medical problems stabilized or resolved: Yes Denies suicidal/homicidal ideation:Yes Issues/concerns per patient self-inventory: Does not want to be on Thorazine, believes people being brought into hospital to speak with patients (specifically, [facility] peer support specialist) are a "front" for harming them Other:  New problem(s) identified:  Reason for Continuation of Hospitalization: Delusions  Medication stabilization  Interventions implemented related to continuation of hospitalization:  Medication Management; safety checks q 15 mins  Additional comments: wants to discharge, goes back and forth between whether parents are supportive or "setting [him] up", becoming more distrustful of staff, patients, and visitors  Estimated length of stay: 3-4 days  Discharge Plan: Levi Moore plan is to discharge home with mother, but mother reports he can only stay temporarily; follow up with Dr. Garner Nash at [facility]  New goal(s):  Review of initial/current patient goals per problem list:   1. Goal(s): Reduce/eliminate paranoia (delusions that someone is out to get him)  Met: No  Target date: by discharge  As evidenced by: Levi Moore delusions seem to be worsening - no longer feels safe in the hospital   2. Goal (s): Reduce anxiety to less than seven  Met: Yes  Target date: by discharge  As evidenced by: Levi Moore rates anxity at 6 today, down from 10 at admission   3. Goal(s):  Stabilize on medications  Met: No Target date: by discharge  As evidenced by: Levi Moore is now refusing to take Thorazine but will consent to take Seroquel, though he goes back and forth with each dose     Attendees: Patient:     Family:     Physician:  Orson Aloe, MD 06/09/2011 9:45 AM   Nursing:   Quintella Reichert, RN 06/09/2011 9:45 AM   CaseManager:  Juline Patch, LCSW 06/09/2011 9:45 AM   Counselor:  Angus Palms, LCSW 06/09/2011 9:45 AM   Other:  Consuello Bossier, NP 06/09/2011 9:45 AM   Other:  Reyes Ivan, LCSWA 06/09/2011  9:45 AM   Other:  Charlyne Mom, doctoral psych intern   Other:  Nanine Means, RN     Scribe for Treatment Team:   Wynn Banker, LCSW,  06/09/2011 9:45 AM

## 2011-06-09 NOTE — Progress Notes (Addendum)
Patient came to med window, wanted another printout of chlorpromazine which was given to him last week.  Nurse gave pt another printout of chlorpromazine.  Pt again refused this medication at 1700.   Patient did not take today's earlier doses of this medication.  Explained to pt that medication has been prescribed by Dr. Dan Humphreys for his benefit and that medication is for anxiety, agitation.  Patient walked away from nurse.   AC talked to patient who agreed to take seroquel prn.  Patient has been walking hallway, anxiety/agitation, feeling alittle better now.

## 2011-06-09 NOTE — Progress Notes (Signed)
Writer observed patient sitting in the dayroom watching tv but no interaction with peers. Writer informed patient that he had 2000 scheduled medication to take and would meet him at medication window. Patient came to window and patient informed him what medication was due he reported that he was not sure if he wanted to take it. Patient asked writer to look at his eye. Writer asked what was wrong and patient reported that his right eyes was red and he feels like it has something to do with the medication he is taking. Writer encouraged patient to speak with his dr concerning this matter if he felt it was due to his medication. Writer showed patient his medication and allowed him to read the label, opened medication, Seroquel 100 mg and placed  It in cup. Patient drank his water and looked at the medication and reported that he wasn't ready to take it just yet and asked could he take it in 10 mins. Writer placed medication in his drawer and asked patient to come back when ready. Patient returned later and took his medication.  Patient appears anxious and is still delusoinal in brief conversations. Currently denies having pain -si/hi/a/v hall. Will continue to monitor.

## 2011-06-09 NOTE — Progress Notes (Signed)
Patient seen during d/c planning group.   He shared that he had been told that if he does not give consent for contact with parents he will be transferred to another hospital and get lost in the system forever.  Writer and counselor advised patient that the information is incorrect and that the has the right to refuse to allow contact with anyone outside of the hospital.  He also questioned Dr. Dan Humphreys starting him on Thorazine and stated he will take Seroquel but does not want to take Thorazine.  Patient encouraged to discuss medication concerns with MD.

## 2011-06-09 NOTE — Progress Notes (Signed)
[facility] Group Notes:  (Counselor/Nursing/MHT/Case Management/Adjunct)  06/09/2011 12:12 PM  Type of Therapy:  Group Therapy  Participation Level:  None  Participation Quality:  Attentive  Affect:  Appropriate  Cognitive:  Appropriate  Insight:  Limited  Engagement in Group:  Limited  Engagement in Therapy:  Limited  Modes of Intervention:  Support  Summary of Progress/Problems: Levi Moore was quiet for most of group, participating only once to offer an example of a time he had felt judged, and what he had done to cope.  Mendleson, Berry Godsey 06/09/2011, 12:12 PM

## 2011-06-09 NOTE — Progress Notes (Signed)
Patient ID: Levi Moore, male   DOB: June 03, 1980, 31 y.o.   MRN: [id number] Patient expressed concern regarding continued medication order re:Thorazine.  He did not recall the earlier administration of Seroquel 25mg  (PRN order) at 1800.  Med orders re: Thorazine and the scheduled/PRN Seroquel was reviewed with him.  He stated that he needed a few minutes "to think."  He denies SI/HI.  He appears to responding to internal stimuli.  He is observed to be pacing the hallway frequently.  Trinda Pascal, RN.

## 2011-06-09 NOTE — Progress Notes (Signed)
[facility] Group Notes: (Counselor/Nursing/MHT/Case Management/Adjunct) 06/09/2011   @1 :15pm  Type of Therapy:  Group Therapy  Participation Level:  Limited  Participation Quality:  Attentive  Affect:  Anxious  Cognitive:  Delusional  Insight:  Limited  Engagement in Group: Limited  Engagement in Therapy:  None  Modes of Intervention:  Support and Exploration  Summary of Progress/Problems: Levi Moore seemed attentive to group, and stated that sometimes he believes letting out emotions rather than allowing them to build up is like to knocking down buildings with a crane. He indicated that it is painful and causes problems to knock down buildings, but is the only way that there will be room to build more. Levi Moore seemed very focused on counselor, watching her face for much of group.   Billie Lade 06/09/2011 2:30 PM

## 2011-06-10 MED ORDER — CHLORPROMAZINE HCL 50 MG PO TABS
25.0000 mg | ORAL_TABLET | Freq: Three times a day (TID) | ORAL | Status: DC
  Administered 2011-06-10 – 2011-06-13 (×10): 25 mg via ORAL
  Filled 2011-06-10 (×12): qty 1

## 2011-06-10 MED ORDER — BUSPIRONE HCL 5 MG PO TABS
5.0000 mg | ORAL_TABLET | Freq: Two times a day (BID) | ORAL | Status: DC
  Administered 2011-06-10 – 2011-06-13 (×7): 5 mg via ORAL
  Filled 2011-06-10: qty 1
  Filled 2011-06-10 (×2): qty 28
  Filled 2011-06-10: qty 1
  Filled 2011-06-10: qty 28
  Filled 2011-06-10: qty 1
  Filled 2011-06-10: qty 28
  Filled 2011-06-10 (×3): qty 1

## 2011-06-10 MED ORDER — CHLORPROMAZINE HCL 50 MG PO TABS
50.0000 mg | ORAL_TABLET | Freq: Every day | ORAL | Status: DC
  Administered 2011-06-10 – 2011-06-12 (×3): 50 mg via ORAL
  Filled 2011-06-10 (×4): qty 1

## 2011-06-10 NOTE — Progress Notes (Signed)
Grief and Loss Group 06/10/11  Facilitated a grief/loss group 10-11am. Provided psychoeducation on types of loss and normal grief reactions, invited group members to discuss grief/loss and to process ways to engage grieving process and overcome it. Topics pf grief/loss in current group focused on loss of interpersonal relationships and death/dying. The group was active and engaged w/ strong sense of universality.  Pt was active and engaged in the group, shared struggles interpersonally and in trying to overcome obstacles. Pt used several metaphors (re: self as "fragile nearly empty bag of chips" and obstacles as "buldozer", trying to navigate life and problems as walking through an antique shop of glass figurines). Pt related to several members as they shared and empathized, shared his own struggles and where he wants to be in life.  Leshia Kope B MS, LPCA, NCC

## 2011-06-10 NOTE — Progress Notes (Signed)
[facility] Group Notes: (Counselor/Nursing/MHT/Case Management/Adjunct) 06/10/2011   @  11:00am   Type of Therapy:  Group Therapy  Participation Level:  Minimal  Participation Quality:  Inattentive, Drowsy  Affect:  Blunted, Irritable  Cognitive:  Delusional  Insight:  None  Engagement in Group: None  Engagement in Therapy:  None  Modes of Intervention:  Support and Exploration  Summary of Progress/Problems: Levi Moore was not attentive or engaged. He did agree once that recovery needs to be taken "One day at a time" as suggested by another group member. Levi Moore seemed irritated with some of the conversation and closed his eyes as if sleeping for remainder of group.  Billie Lade 06/10/2011 12:12 PM

## 2011-06-10 NOTE — Progress Notes (Signed)
Patient quite ambivalent about whether or not he would take medications this morning.  Dr. Dan Humphreys explained what the medication was for and why he thought it would be helpful to the patient.  He did finally agree to it, then refused the noon dose of Thorazine.  Took the supper time dose with no questions at all.  Was also started on a low dose of Buspar and this medication was explained to him.  He was upset this afternoon about being given a diagnosis of schizophrenia and paced the halls for a long time.  His mom called this evening and informed me that the patient has had problems since his wife left him.  States he has been in psych hospitals off and on for a few years and seems to be getting worse.  States he was in a hospital in December and weaned himself off medication as soon as he got out.  States he does not feel he needs it.  Mom does not believe she can have him live there any longer if he continues to refuse help.  States he has not been able to hold onto a job in 3 years now.  Information relayed to [place] via telephone voice mail.

## 2011-06-10 NOTE — Progress Notes (Signed)
[facility] MD Progress Note  06/10/2011 3:04 PM  Diagnosis:  Axis I: Schizophrenia  ADL's:  Intact  Sleep: Fair  Appetite:  Fair  Suicidal Ideation:  Denies adamantly any suicidal thoughts. Homicidal Ideation:  Denies adamantly any homicidal thoughts.  Mental Status Examination/Evaluation: Objective:  Appearance: Meticulous  Eye Contact::  Good  Speech:  Clear and Coherent  Volume:  Normal  Mood:  3/10 on a scale of 1 is the best and 10 is the worst Anxious: 3 or 4/10 on the same scale.  Affect:  Congruent  Thought Process:  paranoid delusions about they getting him into the hospital and getting him put on medications which he sees as unnecessary  Orientation:  Full  Thought Content:  Delusions  Suicidal Thoughts:  No  Homicidal Thoughts:  No  Memory:  Immediate;   Good  Judgement:  Impaired  Insight:  Lacking  Psychomotor Activity:  Normal  Concentration:  Good  Recall:  Good  Akathisia:  No  Handed:  Right  AIMS (if indicated):     Assets:  Communication Skills Desire for Improvement Financial Resources/Insurance Housing Leisure Time  Sleep:  Number of Hours: 2    Vital Signs:Blood pressure 130/79, pulse 116, temperature 98.8 F (37.1 C), temperature source Other (Comment), resp. rate 18, height 5\' 8"  (1.727 m), weight 82.555 kg (182 lb). Current Medications: Current Facility-Administered Medications  Medication Dose Route Frequency Provider Last Rate Last Dose  . acetaminophen (TYLENOL) tablet 650 mg  650 mg Oral Q6H PRN Franchot Gallo, MD      . alum & mag hydroxide-simeth (MAALOX/MYLANTA) 200-200-20 MG/5ML suspension 30 mL  30 mL Oral Q4H PRN Franchot Gallo, MD   30 mL at 06/07/11 0121  . busPIRone (BUSPAR) tablet 5 mg  5 mg Oral BID Orson Aloe, MD      . chlorproMAZINE (THORAZINE) tablet 25 mg  25 mg Oral TID PRN Orson Aloe, MD   25 mg at 06/06/11 1449  . chlorproMAZINE (THORAZINE) tablet 25 mg  25 mg Oral TID Orson Aloe, MD      . chlorproMAZINE  (THORAZINE) tablet 50 mg  50 mg Oral QHS Orson Aloe, MD      . magnesium hydroxide (MILK OF MAGNESIA) suspension 30 mL  30 mL Oral Daily PRN Franchot Gallo, MD      . omega-3 acid ethyl esters (LOVAZA) capsule 1 g  1 g Oral Daily Orson Aloe, MD   1 g at 06/10/11 0836  . DISCONTD: chlorproMAZINE (THORAZINE) tablet 25 mg  25 mg Oral QID Orson Aloe, MD   25 mg at 06/10/11 0840  . DISCONTD: QUEtiapine (SEROQUEL) tablet 100 mg  100 mg Oral Q2000 Wonda Cerise, MD   100 mg at 06/09/11 2151  . DISCONTD: QUEtiapine (SEROQUEL) tablet 25 mg  25 mg Oral Q4H PRN Orson Aloe, MD   25 mg at 06/10/11 0418    Lab Results: No results found for this or any previous visit (from the past 48 hour(s)).  Physical Findings: AIMS:  , ,  ,  ,    CIWA:    COWS:     Treatment Plan Summary: Daily contact with patient to assess and evaluate symptoms and progress in treatment Medication management  Plan: Keep Thorazine at 4 times a day, but shift to 50 mg at HS to see if that helps him sleep as well as the Seroquel 100 mg at HS. Stop Seroquel because he can't be on 2 antipsychotics without a failure on 3 separate  ones first.  He has only been on Risperdal, Seroquel alone before. Try BuSpar as he feels that is all that he needs.  Morgann Woodburn 06/10/2011, 3:04 PM

## 2011-06-11 NOTE — Progress Notes (Signed)
Patient seen during during d/c planning group and or treatment team.  He continues to state that he has concerns about medications and his safety.  He denies any SI/HI, depression and anxiety.

## 2011-06-11 NOTE — Progress Notes (Signed)
[facility] Group Notes:  (Counselor/Nursing/MHT/Case Management/Adjunct)  06/11/2011 3:19 PM  Type of Therapy:  1:15PM Group Therapy  Participation Level:  None  Participation Quality:  Attentive  Affect:  Flat  Cognitive:  Oriented  Insight:  None  Engagement in Group:  None  Engagement in Therapy:  None  Modes of Intervention:  Activity, Education and Exploration  Summary of Progress/Problems: Patient participated in relaxation activity, but did not verbally engage in group discussion.   Levi Moore 06/11/2011, 3:19 PM  Cosigned by: Angus Palms, LCSW
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
## 2011-06-11 NOTE — Progress Notes (Signed)
[facility] Group Notes:  (Counselor/Nursing/MHT/Case Management/Adjunct)  06/11/2011 11:56 AM  Type of Therapy:  Group Therapy  Participation Level:  None  Participation Quality:  Drowsy  Affect:  Appropriate  Cognitive:  Appropriate  Insight:  None  Engagement in Group:  None  Engagement in Therapy:  None  Modes of Intervention:  Support  Summary of Progress/Problems:CLient appeared disengaged during group. At one point he appeared to be writing something on a small piece of paper.   Mendleson, Orvan Papadakis 06/11/2011, 11:56 AM

## 2011-06-11 NOTE — Progress Notes (Signed)
Patient seen during during d/c planning group.  He continues to endorse concerns for his safety but understand the MD wants him to decrease his level of caution.  He denies SI/HI, depression and anxiety.  Patient remains concerns about a discharge date.

## 2011-06-11 NOTE — Progress Notes (Addendum)
Patient ID: Levi Moore, male   DOB: 05-Jan-1981, 31 y.o.   MRN: [id number] Pt is awake and active on the unit this AM. Pt is attending groups and is cooperative with staff. Pt denies SI/HI and AVH. Pt affect is flat and mood is paranoid/suspiscious. However, he is taking his medication. Pt does require some encouragement for medication administration. Writer reinforced MD prescription goal of anxiety reduction and pt acknowledged. Pt forwards little and spends a lot of time pacing in the halls. Writer will continue to monitor. Pt has been agitated this afternoon due to conversations with mother and MD. Pt is endorsing persecutory delusions. Writer encouraged pt to trust his health care team and to take responsibility for his mental illness. Pt was redirectable and responsive to therapeutic communication.
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
## 2011-06-12 DIAGNOSIS — F319 Bipolar disorder, unspecified: Principal | ICD-10-CM

## 2011-06-12 MED ORDER — OMEGA-3-ACID ETHYL ESTERS 1 G PO CAPS: 1.0000 g | ORAL_CAPSULE | Freq: Every day | ORAL | Status: DC

## 2011-06-12 MED ORDER — CARBAMAZEPINE 200 MG PO TABS
200.0000 mg | ORAL_TABLET | Freq: Three times a day (TID) | ORAL | Status: DC
  Administered 2011-06-12 – 2011-06-13 (×4): 200 mg via ORAL
  Filled 2011-06-12 (×5): qty 42
  Filled 2011-06-12 (×2): qty 1
  Filled 2011-06-12: qty 42

## 2011-06-12 MED ORDER — CHLORPROMAZINE HCL 50 MG PO TABS: ORAL_TABLET | ORAL | Status: DC

## 2011-06-12 MED ORDER — BUSPIRONE HCL 5 MG PO TABS: 5.0000 mg | ORAL_TABLET | Freq: Two times a day (BID) | ORAL | Status: DC

## 2011-06-12 MED ORDER — CARBAMAZEPINE 200 MG PO TABS: 200.0000 mg | ORAL_TABLET | Freq: Three times a day (TID) | ORAL | Status: AC

## 2011-06-12 NOTE — Tx Team (Signed)
Interdisciplinary Treatment Plan Update (Adult)  Date:  06/12/2011  Time Reviewed:  10:30 AM   Progress in Treatment: Attending groups:   Yes   Participating in groups:  Yes Taking medication as prescribed:  Yes Tolerating medication:  Yes Family/Significant othe contact made: Contact made with mother Patient understands diagnosis:  Yes Discussing patient identified problems/goals with staff: Yes Medical problems stabilized or resolved: Yes Denies suicidal/homicidal ideation:Yes Issues/concerns per patient self-inventory:   None identified Other:  New problem(s) identified:  Reason for Continuation of Hospitalization: Delusions  Medication stabilization  Interventions implemented related to continuation of hospitalization:  Medication Management; safety checks q 15 mins  Additional comments:  Estimated length of stay: 1-3 days  Discharge Plan:  Discharge home with mother - follow up with [facility]  New goal(s):  Review of initial/current patient goals per problem list:    1.  Goal(s):  Reduce/eliminate paranoia ( someone out to get him)  Met:  No  Target date: d/c  As evidenced by: Jilda Panda will no longer endorse someone out to get him  - Return to baseline  2.  Goal (s):  Reduce anxiety  Met:  No  Target date:  d/c  As evidenced by:  3.  Goal(s):  Stabilize on medications  Met:  No  Target date:  d/c  As evidenced by:  Jilda Panda will report medications are working - less symptomatic  4.  Goal(s):  Met:  No  Target date:  As evidenced by:  Attendees:  Patient:     Family:     Physician:  Orson Aloe, MD 06/12/2011 10:30 AM   Nursing:   Kela Millin, RN 06/12/2011 10:30 AM   CaseManager:  Juline Patch, LCSW 06/12/2011 10:30 AM   Counselor:  Angus Palms, LCSW 06/12/2011 10:30 AM    06/12/2011 10:30 AM    06/12/2011  10:30 AM   Other:     Other:      Scribe for Treatment Team:   Wynn Banker, LCSW,  06/12/2011 10:30 AM

## 2011-06-12 NOTE — Progress Notes (Signed)
Patient ID: Levi Moore, male   DOB: December 25, 1980, 31 y.o.   MRN: [id number]       Earlier this evening I was approached by the evening AC to discontinue the D/C order for Levi Moore as the patient was now delusional and did not want to be discharged.  AC tells me that the patient's mother is here to pick him up, but she is unwilling to take him home and she seems at her wit's end. Then Levi Moore did change his mind and want to be discharged, but not to his mother's house.      The [facility] and I met with the patient and his mother.  Levi Moore stated that she was not clear that Levi Moore was to be discharged today, and that she did not feel comfortable with him coming to her home. She voiced concern that he would leave the minute he got there, and that he would not be compliant with his medications.  Levi Moore was aggressive and argumentative and stated that his mother had already agreed to allow him to come home with her, but was now changing her mind.      When I asked what the back up plan was, Levi Moore did not have one.  He prevaricated a bit and then tried to state that he could go to his father's, then he could sleep in a jeep, he could call some friends, he could camp in the woods.  As it was clear the was no solid back up plan in place for the evening, and his mother seemed unwilling to have him come home without some clear guidelines, I cancelled his discharge for the evening.        Levi Moore was not pleased, but did seem relieved by this decision.  He is informed that he will have to meet with his provider and CM in the morning to develop a solid plan of action with an acceptable discharge destination.  He would not be discharged to a shelter or to the streets as it is against hospital policy.  Levi Moore. Levi Moore PAC

## 2011-06-12 NOTE — Tx Team (Signed)
Interdisciplinary Treatment Plan Update (Adult)  Date:  06/12/2011  Time Reviewed:  10:17 AM   Progress in Treatment: Attending groups:   Yes   Participating in groups:  Yes Taking medication as prescribed:  Yes Tolerating medication:  Yes Family/Significant othe contact made:  Patient understands diagnosis:  Yes Discussing patient identified problems/goals with staff: Yes Medical problems stabilized or resolved: Yes Denies suicidal/homicidal ideation:Yes Issues/concerns per patient self-inventory:  Other:  New problem(s) identified:  Reason for Continuation of Hospitalization: Delusions  Medication stabilization  Interventions implemented related to continuation of hospitalization:  Medication Management; safety checks q 15 mins  Additional comments:  Estimated length of stay: 2-3 days  Discharge Plan: discharge home with mother, follow up with Dr. Marcelline Mates goal(s):  Review of initial/current patient goals per problem list:    1. Goal(s): Reduce/Eliminate paranoia (delusions that someone is out to get him)  Met: No  Target date: d/c  As evidenced by: Jilda Panda will report he has returned to baseline, currently mother reports he is not at baseline, and Luigi still believes that people are trying to hurt him for his inheritance 2. Goal (s): Reduce anxiety to less than seven  Met: yes Target date: d/c  As evidenced by: Jilda Panda will have a lower rating on the anxiety scale, reports anxiety at 3 today 3. Goal(s): Stabilize on medications  Met: Yes  Target date: d/c  As evidenced by: Jilda Panda will report medications are working - less symptoms with no intolerable side effects   Attendees: Patient:     Family:     Physician:  Orson Aloe, MD 06/12/2011 10:17 AM   Nursing:   Izola Price, RN 06/12/2011 10:17 AM   CaseManager:  Juline Patch, LCSW 06/12/2011 10:17 AM   Counselor:  Angus Palms, LCSW 06/12/2011 10:17 AM   Other:  Consuello Bossier, NP 06/12/2011 10:17 AM     Other:  [name], counseling intern 06/12/2011  10:17 AM  Other:     Other:      Scribe for Treatment Team:   Wynn Banker, LCSW,  06/12/2011 10:17 AM

## 2011-06-12 NOTE — Progress Notes (Signed)
[facility] Group Notes:  (Counselor/Nursing/MHT/Case Management/Adjunct)  06/12/2011 2:59 PM  Type of Therapy:  1:15PM Group Therapy  Participation Level:  Did Not Attend  Levi Moore 06/12/2011, 2:59 PM  Cosigned by: Angus Palms, LCSW

## 2011-06-12 NOTE — Discharge Summary (Signed)
Physician Discharge Summary Note  Patient:  Levi Moore is an 31 y.o., male MRN:  [id number] DOB:  1981/03/11 Patient phone:  [phone] (home)  Patient address:   [address] [place] [address],   Date of Admission:  06/03/2011 Date of Discharge: 06/12/11  Reason for Admission: Paranoid thinking, capgras delusions Discharge Diagnoses: Principal Problem:  *Bipolar disorder Active Problems:  Capgras delusion   Axis Diagnosis:   AXIS I:  Capgras delusions, Bipolar disorder AXIS II:  Deferred AXIS III:   Past Medical History  Diagnosis Date  . Anxiety    AXIS IV:  economic problems, housing problems, occupational problems, other psychosocial or environmental problems and issue of trust AXIS V:  51-60 moderate symptoms  Level of Care:  OP  Hospital Course: Mr. Brule is a 31 year old Caucasian male. Patient is a walk-in admission to [facility]. Patient reports, "My mother drove me here on the advice of my doctor. Personally I do not want to be here. I live with my mother. She nags me a lot. I am under a lot of stress. I am very agitated as a result of all the stressors in my life. My doctor convinced me that I need to come to this hospital to be safe. I feel like I am caught in a war between some bad people. I believe that there is another Nathaniel Wakeley out there. I know he is doing a lot of illegal stuff. I am being blamed for it. It is an identity issue. Some one has an incentive for me to fail. I know his name is Jackquline Bosch. I am watchful where ever I go. To live my life this way, being haunted by so many bad people is unfair. While a patient in this hospital, Mr. Nahar was started on medication regimen and also enrolled in group counseling and activities. He continuously argued about his medications, fearing that he will be labeled crazy based on the medication he is on. He seem quite convinced that he is either being followed and or monitored. He constantly seek providers' opinions and  validations as to where to live, whether to seek employment or go back to school. He has problems accepting the facts that his thinking about being followed and or watched could be related to his mental health problems and will need to be on medication to control these symptoms. He also believed that because he came across an article whereby Thorazine is being used to treat a skin condition called prophyria, that his doctor is trying to label him a prophyria patient. He insisted on having Buspar prescribed as he thinks it will take care of the paranoia problems. Patient is noted to be guarded and suspicious whenever he is giving out any form of information about himself. When asked about what he has learned from being in this hospital, patient replied, "never to go to the hospital". Patient is being discharged to his home with family. He is provided with 2 weeks worth of his discharged medications. He was also provided with the address, date and time of his follow-up appointment. Patient will continue psychiatric care on an outpatient basis at the [facility] with Willette Alma. He left [facility] facility in no apparent distress.  Consults:  None  Significant Diagnostic Studies:  None  Discharge Vitals:   Blood pressure 123/84, pulse 108, temperature 98 F (36.7 C), temperature source Oral, resp. rate 18, height 5\' 8"  (1.727 m), weight 82.555 kg (182 lb).  Mental Status Exam: See Mental Status Examination and Suicide Risk Assessment completed by Attending Physician prior to discharge.  Discharge destination:  Home  Is patient on multiple antipsychotic therapies at discharge:  No   Has Patient had three or more failed trials of antipsychotic monotherapy by history:  No  Recommended Plan for Multiple Antipsychotic Therapies: NA   Medication List  As of 06/12/2011  3:49 PM   STOP taking these medications         calcium citrate-vitamin D 200-200 MG-UNIT Tabs      multivitamins ther. w/minerals  Tabs      risperiDONE 0.25 MG tablet         TAKE these medications      Indication    busPIRone 5 MG tablet   Commonly known as: BUSPAR   Take 1 tablet (5 mg total) by mouth 2 (two) times daily. For anxiety       carbamazepine 200 MG tablet   Commonly known as: TEGRETOL   Take 1 tablet (200 mg total) by mouth 3 (three) times daily. For mood control       chlorproMAZINE 50 MG tablet   Commonly known as: THORAZINE   25 mg 3 times daily, take 50 mg at bedtime    For anxiety and mood control       omega-3 acid ethyl esters 1 G capsule   Commonly known as: LOVAZA   Take 1 capsule (1 g total) by mouth daily. For cholesterol            Follow-up Information    Follow up with Willette Alma, P.A.  - [facility] on 06/20/2011. (Your appointment with Willette Alma is scheduled for 10:200 a.m. on Friday, June 20, 2011)    Contact information:   [address] [place], [place]  [address]  [phone]         Follow-up recommendations:  Other:  Keep all scheduled follow-up appointements as recommended.  Comments:  Take all your medications as prescribed.                       Report any adverse effects of medications to your outpatient provider promptly.   SignedArmandina Stammer I 06/12/2011, 3:49 PM

## 2011-06-12 NOTE — Progress Notes (Signed)
Patient ID: Levi Moore, male   DOB: September 27, 1980, 31 y.o.   MRN: [id number] Pt spoke of filling out his consent form, however was not ready to do so.  Stated he doesn't mind taking his meds, but doesn't appreciate someone, referring to the Dr, giving him an Chief Operating Officer. Stated, "Im the kind of person that has to have things explained. I need to see the benefits, especially when someone is trying to rush me. Support and encouragement was offered.

## 2011-06-12 NOTE — Progress Notes (Signed)
[facility] Group Notes:  (Counselor/Nursing/MHT/Case Management/Adjunct)  06/12/2011 12:49 PM  Type of Therapy:  Group Therapy  Participation Level:  Did Not Attend  [name] 06/12/2011, 12:49 PM  Cosigned by: Angus Palms, LCSW

## 2011-06-12 NOTE — Progress Notes (Signed)
Patient appeared agitated and irritable during this assessment. "I'm suppose to be discharged today, but my mom doesn't want me at home". He appeared very sad and irritable. Although he denied SI/HI and denied hallucinations. There was an argument between patient and the hall technicians, they said patient had a form that suppose to be in his chart in his bag. Pt denied that, we went through his bag and didn't found the form. He said the only document he had with him was the form he suppose to sign for his discharge. Q 15 minute check continues to maintain safety.

## 2011-06-12 NOTE — Progress Notes (Signed)
Patient's mother came to discuss patient's discharge information with her son and staff before patient was discharged.   Information was reviewed with patient.  Patient requested to talk to [facility] before he signed discharge information packet.  AC talked with patient and his mother.   P.A. Involved in meeting also.
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
## 2011-06-12 NOTE — [facility] Suicide Risk Assessment (Signed)
Suicide Risk Assessment  Discharge Assessment     Demographic factors:  Assessment Details Time of Assessment: Admission Information Obtained From: Patient Current Mental Status:    Risk Reduction Factors:  Risk Reduction Factors: Sense of responsibility to family;Living with another person, especially a relative;Positive social support  CLINICAL FACTORS:   Severe Anxiety and/or Agitation Bipolar Disorder:   Bipolar II More than one psychiatric diagnosis Previous Psychiatric Diagnoses and Treatments  COGNITIVE FEATURES THAT CONTRIBUTE TO RISK:  Closed-mindedness Thought constriction (tunnel vision)    SUICIDE RISK:   Minimal: No identifiable suicidal ideation.  Patients presenting with no risk factors but with morbid ruminations; may be classified as minimal risk based on the severity of the depressive symptoms  ADL's:  Intact  Sleep: Good  Appetite:  Good  Suicidal Ideation:  Denies adamantly any suicidal thoughts. Homicidal Ideation:  Denies adamantly any homicidal thoughts.  Mental Status Examination/Evaluation: Objective:  Appearance: Casual  Eye Contact::  Good  Speech:  Clear and Coherent  Volume:  Normal  Mood:  Euthymic  Affect:  Congruent  Thought Process:  Had been very cautious yesterday and quite irritable with his mother and me, but today has had a more cooperative demeanor and somewhat less cautious.  Orientation:  Full  Thought Content:  Tending to be pretty cautious  Suicidal Thoughts:  No  Homicidal Thoughts:  No  Memory:  Immediate;   Good  Judgement:  Good  Insight:  Good  Psychomotor Activity:  Normal  Concentration:  Good  Recall:  Good  Akathisia:  No  AIMS (if indicated):     Assets:  Communication Skills Desire for Improvement Intimacy Physical Health  Sleep: Number of Hours: 6.5    Vital Signs: Blood pressure 123/84, pulse 108, temperature 98 F (36.7 C), temperature source Oral, resp. rate 18, height 5\' 8"  (1.727 m), weight 82.555  kg (182 lb).  Labs No results found for this or any previous visit (from the past 48 hour(s)).  What pt has learned from hospital stay is never come to the hospital  Risk of self harm is elevated by his diagnosis of bipolar disorder, but he has his entire life to live for.  Risk of harm to others is elevated by his paranoid flavor to his demeanor, has not been involved in fights or has a legal record.  PLAN: Discharge home Continue Medication List  As of 06/12/2011  3:02 PM   STOP taking these medications         calcium citrate-vitamin D 200-200 MG-UNIT Tabs      multivitamins ther. w/minerals Tabs      risperiDONE 0.25 MG tablet         TAKE these medications         busPIRone 5 MG tablet   Commonly known as: BUSPAR   Take 1 tablet (5 mg total) by mouth 2 (two) times daily. For anxiety      carbamazepine 200 MG tablet   Commonly known as: TEGRETOL   Take 1 tablet (200 mg total) by mouth 3 (three) times daily. For mood control      chlorproMAZINE 50 MG tablet   Commonly known as: THORAZINE   25 mg 3 times daily, take 50 mg at bedtime    For anxiety and mood control      omega-3 acid ethyl esters 1 G capsule   Commonly known as: LOVAZA   Take 1 capsule (1 g total) by mouth daily. For cholesterol  Korri Ask 06/12/2011, 3:01 PM

## 2011-06-12 NOTE — Progress Notes (Signed)
Patient up and in the milieu most of the day.  Has been attending groups.  Has taken medications and has not been questioning them at all.  Was noted to be on the phone earlier today and was angry with his mother.  I did attempt to speak with him after that and he stated he did not want to talk about it at this time.  States he does not know if he is being discharged today and doesn't know what he is going to do about affording medications after discharge.  States he wants to get a job, but has not been able to hold a job for about 3 years according to his mother.  Has been cooperative, but a bit irritable today.

## 2011-06-13 MED ORDER — CHLORPROMAZINE HCL 50 MG PO TABS: 50.0000 mg | ORAL_TABLET | Freq: Once | ORAL | Status: DC

## 2011-06-13 MED ORDER — LORAZEPAM 1 MG PO TABS
1.0000 mg | ORAL_TABLET | Freq: Once | ORAL | Status: DC
  Filled 2011-06-13: qty 1

## 2011-06-13 MED ORDER — TRAZODONE HCL 50 MG PO TABS: 50.0000 mg | ORAL_TABLET | Freq: Every day | ORAL | Status: AC

## 2011-06-13 NOTE — Progress Notes (Signed)
Patient ID: Levi Moore, male   DOB: 1980-07-11, 31 y.o.   MRN: [id number] Pt is awake and active on the unit this AM. Pt is attending groups. He is somewhat agitated today but is redirectable. Pt is pacing and worried about his discharge plan. Pt mother spoke with case manager and will likely allow pt to return home. Pt denies SI/HI and AVH. Writer offered emotional support and encouraged him to focus on how he can continue to make progress. Pt mood is labile and needs a lot of redirection. Pt endorses paranoid delusions that his health care team and family want to harm him rather than help him. Writer will continue to monitor.

## 2011-06-13 NOTE — [facility] Suicide Risk Assessment (Signed)
Suicide Risk Assessment  Discharge Assessment     Demographic factors:  Assessment Details Time of Assessment: Discharge Information Obtained From: Patient Current Mental Status:    Risk Reduction Factors:  Risk Reduction Factors: Sense of responsibility to family;Living with another person, especially a relative;Positive social support  CLINICAL FACTORS:   Severe Anxiety and/or Agitation Bipolar Disorder:   Bipolar II Previous Psychiatric Diagnoses and Treatments  COGNITIVE FEATURES THAT CONTRIBUTE TO RISK:  Closed-mindedness Polarized thinking Thought constriction (tunnel vision)    SUICIDE RISK:   Minimal: No identifiable suicidal ideation.  Patients presenting with no risk factors but with morbid ruminations; may be classified as minimal risk based on the severity of the depressive symptoms  ADL's:  Intact  Sleep: Good  Appetite:  Good  Suicidal Ideation:  Denies adamantly any suicidal thoughts. Homicidal Ideation:  Denies adamantly any homicidal thoughts.  Mental Status Examination/Evaluation: Objective:  Appearance: Casual  Eye Contact::  Good  Speech:  Clear and Coherent  Volume:  Normal  Mood:  Euthymic  Affect:  Congruent  Thought Process:  Coherent  Orientation:  Full  Thought Content:  Pt still has some very fixed beliefs that have a paranoid twist to them.  He sees that he needs to get out and stay out of his mother's house.  Suicidal Thoughts:  No  Homicidal Thoughts:  No  Memory:  Immediate;   Good  Judgement:  Good  Insight:  Good  Psychomotor Activity:  Normal  Concentration:  Good  Recall:  Good  Akathisia:  No  AIMS (if indicated):     Assets:  Communication Skills Leisure Time Resilience Talents/Skills Vocational/Educational  Sleep: Number of Hours: 6.5    Vital Signs: Blood pressure 125/88, pulse 103, temperature 98 F (36.7 C), temperature source Oral, resp. rate 16, height 5\' 8"  (1.727 m), weight 82.555 kg (182 lb).  Labs No  results found for this or any previous visit (from the past 48 hour(s)).  What pt has learned from hospital stay is that he doesn't like to be in the hospital  Risk of self harm is elevated by his diagnoses and his rigid self expectations  Risk of harm to others is elevated by his paranoia, but he is trying to control his interactions with his mother by planning to move out of her house within a month  PLAN: Discharge home Continue Medication List  As of 06/16/2011  4:25 PM   STOP taking these medications         calcium citrate-vitamin D 200-200 MG-UNIT Tabs      multivitamins ther. w/minerals Tabs      risperiDONE 0.25 MG tablet         TAKE these medications         carbamazepine 200 MG tablet   Commonly known as: TEGRETOL   Take 1 tablet (200 mg total) by mouth 3 (three) times daily. For mood control      chlorproMAZINE 50 MG tablet   Commonly known as: THORAZINE   25 mg 3 times daily, take 50 mg at bedtime    For anxiety and mood control      omega-3 acid ethyl esters 1 G capsule   Commonly known as: LOVAZA   Take 1 capsule (1 g total) by mouth daily. For cholesterol      traZODone 50 MG tablet   Commonly known as: DESYREL   Take 1 tablet (50 mg total) by mouth at bedtime. For insomnia.  Hailee Hollick 06/16/2011, 4:25 PM

## 2011-06-13 NOTE — Progress Notes (Signed)
Pt D/D home with mother. Pt rx and follow-up visits were reviewed and pt verbalized understanding. Pt belongings were returned. Pt denies SI/HI.

## 2011-06-16 NOTE — Discharge Summary (Addendum)
Addendum: Date of Discharge 2012.02.15 Pt did not leave on the 14th as he had originally requested because his mother was caught by surprise and did not know that he was close to being ready to leave. He became quite cautious again that evening and the next AM.  He agreed to taking Tegretol for mood control and Thorazine during the day for anxiety and as bedtime for getting to sleep.  He agreed that he needed to get out of his mother's house within the month.  She was agreeable with that plan and was encouraged to use alternate means to communicate with him such as writing down what she needs to say and then leave.  The pt's level of caution was rather high during the day of discharge, but the level reduced before discharge.

## 2011-06-17 NOTE — Progress Notes (Signed)
Patient Discharge Instructions:  Admission Note Faxed,  06/17/2011 After Visit Summary Faxed,  06/17/2011 Faxed to the [facility] Care provider:  06/17/2011 D/C Summary Note faxed 06/17/2011 Facesheet faxed 06/17/2011  Faxed to [facility] Trey Paula holland PA @ [phone]  Wandra Scot, 06/17/2011, 3:55 PM

## 2014-07-14 ENCOUNTER — Inpatient Hospital Stay (HOSPITAL_COMMUNITY): Payer: Self-pay

## 2014-07-14 ENCOUNTER — Inpatient Hospital Stay (HOSPITAL_COMMUNITY)
Admission: EM | Admit: 2014-07-14 | Discharge: 2014-08-02 | DRG: 565 | Disposition: A | Discharge status: Other Acute Inpatient Hospital | Payer: Self-pay | Attending: General Surgery | Admitting: General Surgery

## 2014-07-14 ENCOUNTER — Emergency Department (HOSPITAL_COMMUNITY): Payer: Self-pay

## 2014-07-14 ENCOUNTER — Encounter (HOSPITAL_COMMUNITY): Payer: Self-pay

## 2014-07-14 DIAGNOSIS — S060X9A Concussion with loss of consciousness of unspecified duration, initial encounter: Secondary | ICD-10-CM | POA: Diagnosis present

## 2014-07-14 DIAGNOSIS — S069XAA Unspecified intracranial injury with loss of consciousness status unknown, initial encounter: Secondary | ICD-10-CM | POA: Diagnosis present

## 2014-07-14 DIAGNOSIS — S02401A Maxillary fracture, unspecified, initial encounter for closed fracture: Secondary | ICD-10-CM | POA: Diagnosis present

## 2014-07-14 DIAGNOSIS — Z590 Homelessness: Secondary | ICD-10-CM

## 2014-07-14 DIAGNOSIS — S028XXA Fractures of other specified skull and facial bones, initial encounter for closed fracture: Principal | ICD-10-CM | POA: Diagnosis present

## 2014-07-14 DIAGNOSIS — G4700 Insomnia, unspecified: Secondary | ICD-10-CM | POA: Diagnosis present

## 2014-07-14 DIAGNOSIS — Z8659 Personal history of other mental and behavioral disorders: Secondary | ICD-10-CM

## 2014-07-14 DIAGNOSIS — F319 Bipolar disorder, unspecified: Secondary | ICD-10-CM | POA: Diagnosis present

## 2014-07-14 DIAGNOSIS — Z9114 Patient's other noncompliance with medication regimen: Secondary | ICD-10-CM | POA: Diagnosis present

## 2014-07-14 DIAGNOSIS — R45851 Suicidal ideations: Secondary | ICD-10-CM | POA: Diagnosis present

## 2014-07-14 DIAGNOSIS — S069X9A Unspecified intracranial injury with loss of consciousness of unspecified duration, initial encounter: Secondary | ICD-10-CM | POA: Diagnosis present

## 2014-07-14 DIAGNOSIS — S0181XA Laceration without foreign body of other part of head, initial encounter: Secondary | ICD-10-CM | POA: Diagnosis present

## 2014-07-14 DIAGNOSIS — R454 Irritability and anger: Secondary | ICD-10-CM | POA: Diagnosis present

## 2014-07-14 DIAGNOSIS — Z888 Allergy status to other drugs, medicaments and biological substances status: Secondary | ICD-10-CM

## 2014-07-14 DIAGNOSIS — S0292XA Unspecified fracture of facial bones, initial encounter for closed fracture: Secondary | ICD-10-CM | POA: Diagnosis present

## 2014-07-14 DIAGNOSIS — F259 Schizoaffective disorder, unspecified: Secondary | ICD-10-CM | POA: Diagnosis present

## 2014-07-14 DIAGNOSIS — S0291XA Unspecified fracture of skull, initial encounter for closed fracture: Secondary | ICD-10-CM

## 2014-07-14 DIAGNOSIS — F419 Anxiety disorder, unspecified: Secondary | ICD-10-CM | POA: Diagnosis present

## 2014-07-14 DIAGNOSIS — T1490XA Injury, unspecified, initial encounter: Secondary | ICD-10-CM

## 2014-07-14 DIAGNOSIS — F22 Delusional disorders: Secondary | ICD-10-CM | POA: Diagnosis present

## 2014-07-14 DIAGNOSIS — G9389 Other specified disorders of brain: Secondary | ICD-10-CM | POA: Diagnosis present

## 2014-07-14 DIAGNOSIS — S02109A Fracture of base of skull, unspecified side, initial encounter for closed fracture: Secondary | ICD-10-CM | POA: Diagnosis present

## 2014-07-14 DIAGNOSIS — S0285XA Fracture of orbit, unspecified, initial encounter for closed fracture: Secondary | ICD-10-CM

## 2014-07-14 DIAGNOSIS — Y900 Blood alcohol level of less than 20 mg/100 ml: Secondary | ICD-10-CM | POA: Diagnosis present

## 2014-07-14 LAB — BASIC METABOLIC PANEL
Anion gap: 9 (ref 5–15)
BUN: 17 mg/dL (ref 6–23)
CHLORIDE: 102 mmol/L (ref 96–112)
CO2: 28 mmol/L (ref 19–32)
Calcium: 8.8 mg/dL (ref 8.4–10.5)
Creatinine, Ser: 0.89 mg/dL (ref 0.50–1.35)
GFR calc non Af Amer: >90 mL/min (ref >90)
Glucose, Bld: 104 mg/dL — ABNORMAL HIGH (ref 70–99)
POTASSIUM: 3.6 mmol/L (ref 3.5–5.1)
Sodium: 139 mmol/L (ref 135–145)

## 2014-07-14 LAB — ETHANOL: Alcohol, Ethyl (B): 37 mg/dL — ABNORMAL HIGH (ref 0–9)

## 2014-07-14 LAB — CBC
HEMATOCRIT: 40 % (ref 39.0–52.0)
Hemoglobin: 13.9 g/dL (ref 13.0–17.0)
MCH: 32.6 pg (ref 26.0–34.0)
MCHC: 34.8 g/dL (ref 30.0–36.0)
MCV: 93.9 fL (ref 78.0–100.0)
Platelets: 344 10*3/uL (ref 150–400)
RBC: 4.26 MIL/uL (ref 4.22–5.81)
RDW: 12.4 % (ref 11.5–15.5)
WBC: 11.1 10*3/uL — AB (ref 4.0–10.5)

## 2014-07-14 LAB — MRSA PCR SCREENING: MRSA by PCR: NEGATIVE

## 2014-07-14 MED ORDER — PANTOPRAZOLE SODIUM 40 MG IV SOLR
40.0000 mg | Freq: Every day | INTRAVENOUS | Status: DC
  Administered 2014-07-14: 40 mg via INTRAVENOUS
  Filled 2014-07-14 (×2): qty 40

## 2014-07-14 MED ORDER — LIDOCAINE-EPINEPHRINE 1 %-1:100000 IJ SOLN
10.0000 mL | Freq: Once | INTRAMUSCULAR | Status: DC
  Filled 2014-07-14: qty 1
  Filled 2014-07-14: qty 10

## 2014-07-14 MED ORDER — ONDANSETRON HCL 4 MG/2ML IJ SOLN: 4.0000 mg | Freq: Four times a day (QID) | INTRAMUSCULAR | Status: DC | PRN

## 2014-07-14 MED ORDER — CEFAZOLIN SODIUM 1-5 GM-% IV SOLN
1.0000 g | Freq: Three times a day (TID) | INTRAVENOUS | Status: DC
  Administered 2014-07-14 – 2014-07-17 (×11): 1 g via INTRAVENOUS
  Filled 2014-07-14 (×15): qty 50

## 2014-07-14 MED ORDER — SODIUM CHLORIDE 0.9 % IV BOLUS (SEPSIS)
1000.0000 mL | Freq: Once | INTRAVENOUS | Status: AC
  Administered 2014-07-14: 1000 mL via INTRAVENOUS

## 2014-07-14 MED ORDER — LORAZEPAM 2 MG/ML IJ SOLN
1.0000 mg | Freq: Once | INTRAMUSCULAR | Status: AC
  Administered 2014-07-14: 1 mg via INTRAVENOUS

## 2014-07-14 MED ORDER — ZIPRASIDONE MESYLATE 20 MG IM SOLR
20.0000 mg | Freq: Once | INTRAMUSCULAR | Status: DC
  Filled 2014-07-14: qty 20

## 2014-07-14 MED ORDER — LIDOCAINE-EPINEPHRINE 1 %-1:100000 IJ SOLN: 20.0000 mL | Freq: Once | INTRAMUSCULAR | Status: DC

## 2014-07-14 MED ORDER — STERILE WATER FOR INJECTION IJ SOLN
INTRAMUSCULAR | Status: AC
  Administered 2014-07-14: 1.2 mL
  Filled 2014-07-14: qty 10

## 2014-07-14 MED ORDER — BACITRACIN ZINC 500 UNIT/GM EX OINT
TOPICAL_OINTMENT | Freq: Three times a day (TID) | CUTANEOUS | Status: DC
  Administered 2014-07-14: 1 via TOPICAL
  Administered 2014-07-15: 15.5556 via TOPICAL
  Administered 2014-07-15 (×2): 1 via TOPICAL
  Administered 2014-07-16: 15.5556 via TOPICAL
  Administered 2014-07-16: 1 via TOPICAL
  Administered 2014-07-16 – 2014-07-20 (×13): 15.5556 via TOPICAL
  Administered 2014-07-21: 18:00:00 via TOPICAL
  Administered 2014-07-21 (×2): 15.5556 via TOPICAL
  Administered 2014-07-22 – 2014-07-23 (×4): via TOPICAL
  Administered 2014-07-23: 15.5556 via TOPICAL
  Administered 2014-07-23 – 2014-07-24 (×4): via TOPICAL
  Administered 2014-07-25: 15.5556 via TOPICAL
  Administered 2014-07-25: 22:00:00 via TOPICAL
  Administered 2014-07-25 – 2014-07-26 (×4): 15.5556 via TOPICAL
  Administered 2014-07-27 – 2014-07-31 (×10): via TOPICAL
  Administered 2014-08-01 (×2): 15.5556 via TOPICAL
  Filled 2014-07-14 (×8): qty 28.35

## 2014-07-14 MED ORDER — PANTOPRAZOLE SODIUM 40 MG PO TBEC
40.0000 mg | DELAYED_RELEASE_TABLET | Freq: Every day | ORAL | Status: DC
  Administered 2014-07-15 – 2014-07-17 (×3): 40 mg via ORAL
  Filled 2014-07-14 (×4): qty 1

## 2014-07-14 MED ORDER — ZIPRASIDONE MESYLATE 20 MG IM SOLR
20.0000 mg | Freq: Once | INTRAMUSCULAR | Status: AC
  Administered 2014-07-14: 20 mg via INTRAMUSCULAR

## 2014-07-14 MED ORDER — KCL IN DEXTROSE-NACL 20-5-0.9 MEQ/L-%-% IV SOLN
INTRAVENOUS | Status: DC
  Administered 2014-07-14 – 2014-07-15 (×4): via INTRAVENOUS
  Filled 2014-07-14 (×9): qty 1000

## 2014-07-14 MED ORDER — BUPIVACAINE HCL (PF) 0.5 % IJ SOLN
10.0000 mL | Freq: Once | INTRAMUSCULAR | Status: AC
  Administered 2014-07-14: 10 mL
  Filled 2014-07-14: qty 30

## 2014-07-14 MED ORDER — MORPHINE SULFATE 2 MG/ML IJ SOLN
1.0000 mg | INTRAMUSCULAR | Status: DC | PRN
  Administered 2014-07-14 – 2014-07-16 (×4): 2 mg via INTRAVENOUS
  Filled 2014-07-14 (×4): qty 1

## 2014-07-14 MED ORDER — ONDANSETRON HCL 4 MG/2ML IJ SOLN
4.0000 mg | Freq: Once | INTRAMUSCULAR | Status: AC
  Administered 2014-07-14: 4 mg via INTRAVENOUS
  Filled 2014-07-14: qty 2

## 2014-07-14 MED ORDER — LORAZEPAM 2 MG/ML IJ SOLN
INTRAMUSCULAR | Status: AC
  Filled 2014-07-14: qty 1

## 2014-07-14 MED ORDER — ONDANSETRON HCL 4 MG PO TABS: 4.0000 mg | ORAL_TABLET | Freq: Four times a day (QID) | ORAL | Status: DC | PRN

## 2014-07-14 NOTE — ED Notes (Signed)
[facility] called for transportation 

## 2014-07-14 NOTE — ED Notes (Signed)
Wound irrigated.
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
## 2014-07-14 NOTE — ED Notes (Signed)
Patient pacing around in his room. Threatening to leave. Patient discontinued his IV fluids. Patient dressed in his clothes. EDP in with patient at this time.
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
## 2014-07-14 NOTE — ED Provider Notes (Signed)
Patient wanting to leave department, refuses transfer [facility] because he doesn't want to be in a room.  He cannot express why he wants to leave and does not know why he is here.  He states that he was here because he was bleeding.  He cannot relay what events led to him coming to the ED.  He is not currently taking any of his psychiatric medications and he refuses to comment on his psychiatric history or prescribed medications.  He is mildly agitated, pacing, poor eye contact, low volume speech.  Concern that patient does not have capacity to understand his current illness given his underlying psychiatric illness and medication noncompliance.  Pt given geodon for agitation and IVC papers completed pending psychiatric evaluation.    Tilden FossaElizabeth Ajay Strubel, MD 07/14/14 [phone]
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
## 2014-07-14 NOTE — ED Notes (Signed)
rn called and gave updated report to [facility].

## 2014-07-14 NOTE — ED Notes (Signed)
EMS-called for transport-[facility] on a 3 hour delay
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
## 2014-07-14 NOTE — ED Notes (Signed)
Pt vomited in the lobby

## 2014-07-14 NOTE — ED Notes (Signed)
Patient refusing to go with [facility] to [facility]. Patient is talking into the air and laughing inappropriately. Patient frequently having consversations into the air. Patient has a history of bipolar and anxiety.

## 2014-07-15 DIAGNOSIS — F319 Bipolar disorder, unspecified: Secondary | ICD-10-CM

## 2014-07-15 LAB — BASIC METABOLIC PANEL
Anion gap: 4 — ABNORMAL LOW (ref 5–15)
BUN: 9 mg/dL (ref 6–23)
CO2: 28 mmol/L (ref 19–32)
Calcium: 8.5 mg/dL (ref 8.4–10.5)
Chloride: 105 mmol/L (ref 96–112)
Creatinine, Ser: 0.94 mg/dL (ref 0.50–1.35)
GFR calc Af Amer: >90 mL/min (ref >90)
GFR calc non Af Amer: >90 mL/min (ref >90)
Glucose, Bld: 115 mg/dL — ABNORMAL HIGH (ref 70–99)
Potassium: 3.8 mmol/L (ref 3.5–5.1)
Sodium: 137 mmol/L (ref 135–145)

## 2014-07-15 LAB — CBC
HCT: 36.9 % — ABNORMAL LOW (ref 39.0–52.0)
Hemoglobin: 12.6 g/dL — ABNORMAL LOW (ref 13.0–17.0)
MCH: 32.3 pg (ref 26.0–34.0)
MCHC: 34.1 g/dL (ref 30.0–36.0)
MCV: 94.6 fL (ref 78.0–100.0)
Platelets: 265 10*3/uL (ref 150–400)
RBC: 3.9 MIL/uL — ABNORMAL LOW (ref 4.22–5.81)
RDW: 13.1 % (ref 11.5–15.5)
WBC: 8.1 10*3/uL (ref 4.0–10.5)

## 2014-07-15 MED ORDER — CHLORPROMAZINE HCL 25 MG PO TABS
25.0000 mg | ORAL_TABLET | Freq: Three times a day (TID) | ORAL | Status: DC
  Administered 2014-07-15 – 2014-07-17 (×7): 25 mg via ORAL
  Filled 2014-07-15 (×11): qty 1
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
## 2014-07-15 NOTE — Progress Notes (Signed)
Patient ID: Levi NielsenJared E Eakins, male   DOB: 1980-05-05, 34 y.o.   MRN: [id number] BP 103/64 mmHg  Pulse 60  Temp(Src) 99.1 F (37.3 C) (Oral)  Resp 19  Ht 5\' 9"  (1.753 m)  Wt 77.111 kg (170 lb)  BMI 25.09 kg/m2  SpO2 97% Alert, oriented x 4, speech is clear, fluent Moving all extremities well Facial laceration healing well Ok for transfer
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
## 2014-07-16 ENCOUNTER — Inpatient Hospital Stay (HOSPITAL_COMMUNITY): Payer: Self-pay

## 2014-07-16 ENCOUNTER — Encounter (HOSPITAL_COMMUNITY): Payer: Self-pay | Admitting: Radiology

## 2014-07-16 MED ORDER — ACETAMINOPHEN 325 MG PO TABS
650.0000 mg | ORAL_TABLET | Freq: Four times a day (QID) | ORAL | Status: DC | PRN
  Administered 2014-07-18 – 2014-07-27 (×6): 650 mg via ORAL
  Filled 2014-07-16 (×6): qty 2

## 2014-07-16 MED ORDER — TRAZODONE HCL 50 MG PO TABS
50.0000 mg | ORAL_TABLET | Freq: Every day | ORAL | Status: DC
  Administered 2014-07-16 – 2014-08-01 (×16): 50 mg via ORAL
  Filled 2014-07-16 (×17): qty 1

## 2014-07-16 MED ORDER — OXYCODONE HCL 5 MG PO TABS: 5.0000 mg | ORAL_TABLET | ORAL | Status: DC | PRN

## 2014-07-16 MED ORDER — CARBAMAZEPINE 200 MG PO TABS
200.0000 mg | ORAL_TABLET | Freq: Three times a day (TID) | ORAL | Status: DC
  Administered 2014-07-16 – 2014-08-01 (×49): 200 mg via ORAL
  Filled 2014-07-16 (×6): qty 1
  Filled 2014-07-16: qty 2
  Filled 2014-07-16 (×2): qty 1
  Filled 2014-07-16: qty 2
  Filled 2014-07-16 (×43): qty 1
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
## 2014-07-16 NOTE — Progress Notes (Signed)
Patient ID: Levi NielsenJared E Moore, male   DOB: 05/18/1980, 34 y.o.   MRN: [id number] BP 106/73 mmHg  Pulse 58  Temp(Src) 98.1 F (36.7 C) (Oral)  Resp 10  Ht 5\' 9"  (1.753 m)  Wt 77.111 kg (170 lb)  BMI 25.09 kg/m2  SpO2 95% Alert, following commands Moving all extremities well CT shows expected changes, nothing worrisome at all Stable exam Appeared to have full eom, but he does close the left eye when looking at things implying diplopia
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
## 2014-07-17 DIAGNOSIS — F22 Delusional disorders: Secondary | ICD-10-CM

## 2014-07-17 MED ORDER — MORPHINE SULFATE 2 MG/ML IJ SOLN: 1.0000 mg | INTRAMUSCULAR | Status: DC | PRN

## 2014-07-17 NOTE — Progress Notes (Signed)
Patient looks good this morning.  Complains of intermittent headaches.  Denies diplopia.  Moving all extremities.  Awake and conversant.  stAble for discharge to floor from my standpoint.

## 2014-07-17 NOTE — Progress Notes (Signed)
Received orders to transfer pt 6N; report called to nurse.  Patient's vitals are stable and no s/s of distress.  Patient will be transferred via bed.
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
## 2014-07-18 DIAGNOSIS — S02109A Fracture of base of skull, unspecified side, initial encounter for closed fracture: Secondary | ICD-10-CM | POA: Diagnosis present

## 2014-07-18 DIAGNOSIS — S0181XA Laceration without foreign body of other part of head, initial encounter: Secondary | ICD-10-CM | POA: Diagnosis present

## 2014-07-18 DIAGNOSIS — S0292XA Unspecified fracture of facial bones, initial encounter for closed fracture: Secondary | ICD-10-CM | POA: Diagnosis present

## 2014-07-18 MED ORDER — ENOXAPARIN SODIUM 40 MG/0.4ML ~~LOC~~ SOLN
40.0000 mg | Freq: Every day | SUBCUTANEOUS | Status: DC
  Administered 2014-07-18 – 2014-08-01 (×14): 40 mg via SUBCUTANEOUS
  Filled 2014-07-18 (×14): qty 0.4

## 2014-07-18 MED ORDER — CHLORPROMAZINE HCL 50 MG PO TABS
50.0000 mg | ORAL_TABLET | Freq: Every day | ORAL | Status: DC
  Filled 2014-07-18: qty 1

## 2014-07-18 MED ORDER — CHLORPROMAZINE HCL 100 MG PO TABS
100.0000 mg | ORAL_TABLET | Freq: Every day | ORAL | Status: DC
  Administered 2014-07-18 – 2014-08-01 (×15): 100 mg via ORAL
  Filled 2014-07-18 (×18): qty 1

## 2014-07-18 MED ORDER — CEPHALEXIN 250 MG PO CAPS
250.0000 mg | ORAL_CAPSULE | Freq: Three times a day (TID) | ORAL | Status: AC
  Administered 2014-07-18 – 2014-07-23 (×23): 250 mg via ORAL
  Filled 2014-07-18 (×27): qty 1

## 2014-07-18 MED ORDER — CHLORPROMAZINE HCL 25 MG PO TABS
25.0000 mg | ORAL_TABLET | Freq: Three times a day (TID) | ORAL | Status: DC
  Administered 2014-07-18 – 2014-08-02 (×39): 25 mg via ORAL
  Filled 2014-07-18 (×52): qty 1

## 2014-07-18 NOTE — Progress Notes (Signed)
UR completed.  Merari Pion, RN BSN [facility] CCM Trauma/Neuro ICU Case Manager [phone]  

## 2014-07-18 NOTE — Progress Notes (Signed)
Pt went to  Bathroom and pulled iv out/ nurse went in to restart and pt became very agitated stating he had rights/ / tct Dr Luisa Hartornett and he stated ok to leave out till tomorrow then evaluate if po atbx is needed/
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
## 2014-07-19 LAB — CARBAMAZEPINE LEVEL, TOTAL: Carbamazepine Lvl: 10 ug/mL (ref 4.0–12.0)

## 2014-07-19 MED ORDER — LORAZEPAM 1 MG PO TABS
2.0000 mg | ORAL_TABLET | Freq: Once | ORAL | Status: AC | PRN
  Administered 2014-07-19: 2 mg via ORAL
  Filled 2014-07-19: qty 2
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
## 2014-07-19 NOTE — Progress Notes (Addendum)
LCSW has received case from Psych MD and discussed plan of care. At this time, patient requires inpatient admission due to bizarre behaviors, agitation, disorganized thoughts, safety to self and others, and internal stimuli action.  Patient has bee referred to past hospitals that have served him:  [facility] and [facility].  He has also been referred to [facility] due to behaviors, ?TBI and ability to program.  Patient has been denied by [facility], [place], and [facility] all due to acuity and bed status (no beds)  LCSW has called mom, left message and awaiting for call back regarding status of court pending charges and information about probation officer.  Patient currently under IVC (placed when in the ED).  Will continue to follow. Please call if needs arise!  Plan:  1. Referred to several hospitals for inpatient admission. ( see above) (Completed) 2.  Will begin process for [facility] referral as patient will be difficult to place. CompletedJosem Kaufmann:  [id number] Dates: 3/23- 3/29 with [facility]. Awaiting review from MD and if accepted will be placed on wait list.  3. Call placed to mom, awaiting call back to discuss case/obtain collateral. (completed see below) 4.  Call placed to probation/parole officer with regards to disposition. (compelted, left message and emailed) 5. Will update IVC if needed (review 1st exam and exp. Date to make sure patient is held legally) (Up to date. Papers were completed on 07/14/14. First exam completed which extends time effective to 7 days).  Patient will need papers updated if still here on Friday March 25th by 0700 to hold patient in the hospital.     Addendum:  Spoke at length with patient mom who remembered LCSW from working with son in the past (2013 at [facility]).  Mom reports patient has been in her home up until a year ago when she finally had to send him out due to aggressive, conspiracy thoughts, paranoia, and safety.  Patient has been homeless for the last year, living  on the streets. Reports he has never had problems with "hard" drugs, but has smoked a lot of pot and drank alcohol. Mom reports he has court today, papers sent for patient to have case continued and lawyer working to drop charged. Current charges are failure to appear, petty shoplifting.  Probation officer is Lemar Livings with [facility] appointed by Gaynell Face.  [phone]).  Mom requesting to see patient if he will allow her and LCSW is working to facilitate and gently ask patient.  (Patient does not know he is under IVC or plan for disposition at this time, due to his reaction and behaviors).  Patient has given permission to speak with mom per LCSW weekend worker. Mom reports patient did best on the Invega shot (IM) in the past.  When on this shot, he was able to function, read, communicate effectively and be like her previous son.    1002a Met with patient at the bedside, patient very restless, watching TV, and talking to himself as he is internally preoccupied. Patient aware of LCSW, unknown if he remembers from [facility] at this time, he says yes, however unable to converse. He laughs inappropriately during discussion stating "I don't know what to say, I don't know how to say it, it all is meaningless when they will just try to get you into prison".  Patient very disorganized, but calm at this time. Reports his glasses are broken and he struggles seeing. Offered to call patient mother and get her involved, but patient  denies at this time.  He is aware of her being involved and agreeable. At this time does not feel she is the one causing harm or problems (in the past he has felt mom is trying to sabotage).   Reports no pain at this time.     Lane Hacker, MSW Clinical Social Work: Emergency Room [phone]
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
## 2014-07-20 NOTE — Progress Notes (Signed)
UR completed.  Aubrie Lucien, RN BSN [facility] CCM Trauma/Neuro ICU Case Manager [phone]  
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
## 2014-07-20 NOTE — Progress Notes (Signed)
LCSW received call that [facility] Denied by all other referrals completed on 3/23.  LCSW has updated Engineer, drillingprobation officer via phone and e-mail. Spoken with patient mother who is also in agreement with plan.   Patient will have IVC papers updated on Friday March 25th.  Deretha EmoryHannah Cambrie Sonnenfeld LCSW, MSW Clinical Social Work: Emergency Room [phone]

## 2014-07-21 MED ORDER — HALOPERIDOL LACTATE 5 MG/ML IJ SOLN
INTRAMUSCULAR | Status: AC
  Filled 2014-07-21: qty 1

## 2014-07-21 MED ORDER — HALOPERIDOL LACTATE 5 MG/ML IJ SOLN
5.0000 mg | Freq: Once | INTRAMUSCULAR | Status: AC
  Administered 2014-07-21: 5 mg via INTRAMUSCULAR
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
## 2014-07-21 NOTE — Progress Notes (Signed)
LCSW completed updated IVC paperwork and faxed to magistrate. Copies (4) sent to magistrate.  Will place in patient chart. Awaiting review and service of papers on patient.  [facility].  No other needs at this time.  Patient remains bizarre, agitated for still being in hospital, talking to self ([facility]).  He is guarded and not engaged with speaking to LCSW. Medically stable when bed is available [facility].  LCSW will have weekend SW Simonne ComeLeo to follow up on patient if needs arise.  Deretha EmoryHannah Roanne Haye LCSW, MSW Clinical Social Work: Emergency Room [phone]
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
## 2014-07-24 DIAGNOSIS — S0990XA Unspecified injury of head, initial encounter: Secondary | ICD-10-CM

## 2014-07-24 DIAGNOSIS — R45851 Suicidal ideations: Secondary | ICD-10-CM
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
## 2014-07-27 NOTE — Clinical Social Work Psych Note (Signed)
Involuntary Commitment paperwork expires 07/28/2014.  Psych CSW completed renewed Involuntary Commitment Paperwork (IVC) for patient.  Psychiatrist is aware documents are on the chart ready for signature.  [facility].    Vickii PennaGina Rondal Vandevelde, LCSWA [phone]  Psychiatric & Orthopedics (5N 1-8) Clinical Social Worker

## 2014-07-27 NOTE — Progress Notes (Signed)
Mom in to see pt to bring him some new eye glasses. Pt became very agitated and cussing at her. Pt laughing out  randomly and without reason. Mother stayed with pt app. 10 minutes

## 2014-07-28 NOTE — Clinical Social Work Psych Note (Signed)
Psych CSW spoke with Magistrate [place] who confirmed that IVC paperwork has been received and will be served.  Vickii PennaGina Emoni Yang, LCSWA [phone]  Psychiatric & Orthopedics (5N 1-8) Clinical Social Worker
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
## 2014-07-31 NOTE — Progress Notes (Signed)
[facility] confirmed patient remains on waitlist, but still no beds.   Deretha EmoryHannah Keifer Habib LCSW, MSW Clinical Social Work: Emergency Room [phone]
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
## 2014-08-01 NOTE — Progress Notes (Signed)
CSW spoke with Rene[facility]Lee [facility] at [facility] this pm.  She is requesting additional information in order to place pt in a bed at the facility.  Lee requests a d/c summary, IVC paperwork, MAR, and vitals be sent to [facility] when d/c summary is available.  CSW will update Psych CSW for f/u in am.

## 2014-08-02 MED ORDER — CHLORPROMAZINE HCL 25 MG PO TABS: 25.0000 mg | ORAL_TABLET | Freq: Three times a day (TID) | ORAL | Status: AC

## 2014-08-02 MED ORDER — CHLORPROMAZINE HCL 100 MG PO TABS: 100.0000 mg | ORAL_TABLET | Freq: Every day | ORAL | Status: AC

## 2014-08-02 NOTE — Progress Notes (Signed)
[facility] [place] [facility] [place] [facility] [place] [facility]. Two white bags and one black bag sent with patient along with discharge orders.
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
## 2014-08-02 NOTE — Clinical Social Work Psych Note (Addendum)
Psych CSW was notified that patient's bed [facility] is now available.  MD notified.  DC summary and order complete.  Psych CSW contacted patient's mother to provide discharge update.  [facility] [place]  (Psych CSW has arranged transportation with Sgt. Paschal who reports there are 5 patients on the list in front of ours in need of transportation so no estimated time was able to be given)  RN updated.  Vickii PennaGina Kaneesha Constantino, LCSWA [phone]  Psychiatric & Orthopedics (5N 1-8) Clinical Social Worker
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
